# Patient Record
Sex: Male | Born: 1940 | Race: White | Hispanic: No | Smoking: Former smoker
Health system: Southern US, Community
[De-identification: ages and names within clinical notes are randomized; demographics above are authoritative.]

## PROBLEM LIST (undated history)

## (undated) DIAGNOSIS — E785 Hyperlipidemia, unspecified: Secondary | ICD-10-CM

## (undated) DIAGNOSIS — I714 Abdominal aortic aneurysm, without rupture, unspecified: Secondary | ICD-10-CM

## (undated) DIAGNOSIS — N189 Chronic kidney disease, unspecified: Secondary | ICD-10-CM

## (undated) DIAGNOSIS — I10 Essential (primary) hypertension: Secondary | ICD-10-CM

## (undated) DIAGNOSIS — C61 Malignant neoplasm of prostate: Secondary | ICD-10-CM

## (undated) HISTORY — DX: Hyperlipidemia, unspecified: E78.5

## (undated) HISTORY — DX: Abdominal aortic aneurysm, without rupture: I71.4

## (undated) HISTORY — DX: Abdominal aortic aneurysm, without rupture, unspecified: I71.40

## (undated) HISTORY — DX: Malignant neoplasm of prostate: C61

## (undated) HISTORY — PX: CAROTID ENDARTERECTOMY: SUR193

## (undated) HISTORY — PX: COLONOSCOPY W/ POLYPECTOMY: SHX1380

## (undated) HISTORY — DX: Essential (primary) hypertension: I10

## (undated) HISTORY — PX: TRANSURETHRAL RESECTION OF PROSTATE: SHX73

## (undated) HISTORY — PX: PROSTATE SURGERY: SHX751

## (undated) HISTORY — PX: TONSILLECTOMY AND ADENOIDECTOMY: SUR1326

---

## 2007-02-05 ENCOUNTER — Inpatient Hospital Stay (HOSPITAL_COMMUNITY): Admission: AD | Admit: 2007-02-05 | Discharge: 2007-02-08 | Payer: Self-pay | Admitting: Urology

## 2007-02-26 ENCOUNTER — Encounter (INDEPENDENT_AMBULATORY_CARE_PROVIDER_SITE_OTHER): Payer: Self-pay | Admitting: Urology

## 2007-02-27 ENCOUNTER — Inpatient Hospital Stay (HOSPITAL_COMMUNITY): Admission: RE | Admit: 2007-02-27 | Discharge: 2007-02-28 | Payer: Self-pay | Admitting: Urology

## 2007-03-08 ENCOUNTER — Encounter: Admission: RE | Admit: 2007-03-08 | Discharge: 2007-05-16 | Payer: Self-pay | Admitting: Urology

## 2007-03-26 ENCOUNTER — Ambulatory Visit: Payer: Self-pay | Admitting: Oncology

## 2007-03-26 ENCOUNTER — Ambulatory Visit (HOSPITAL_COMMUNITY): Admission: RE | Admit: 2007-03-26 | Discharge: 2007-03-26 | Payer: Self-pay | Admitting: Urology

## 2007-03-26 ENCOUNTER — Ambulatory Visit (HOSPITAL_BASED_OUTPATIENT_CLINIC_OR_DEPARTMENT_OTHER): Admission: RE | Admit: 2007-03-26 | Discharge: 2007-03-27 | Payer: Self-pay | Admitting: Urology

## 2007-03-27 ENCOUNTER — Ambulatory Visit: Payer: Self-pay | Admitting: Vascular Surgery

## 2007-03-30 LAB — COMPREHENSIVE METABOLIC PANEL
AST: 13 U/L (ref 0–37)
Alkaline Phosphatase: 91 U/L (ref 39–117)
CO2: 21 mEq/L (ref 19–32)
Chloride: 107 mEq/L (ref 96–112)
Creatinine, Ser: 1.73 mg/dL — ABNORMAL HIGH (ref 0.40–1.50)
Total Bilirubin: 0.7 mg/dL (ref 0.3–1.2)
Total Protein: 7.1 g/dL (ref 6.0–8.3)

## 2007-03-30 LAB — CBC WITH DIFFERENTIAL/PLATELET
BASO%: 1.3 % (ref 0.0–2.0)
Basophils Absolute: 0.1 10*3/uL (ref 0.0–0.1)
HCT: 33.4 % — ABNORMAL LOW (ref 38.7–49.9)
HGB: 11.5 g/dL — ABNORMAL LOW (ref 13.0–17.1)
MCHC: 34.4 g/dL (ref 32.0–35.9)
MONO#: 0.5 10*3/uL (ref 0.1–0.9)
NEUT%: 58.3 % (ref 40.0–75.0)
WBC: 7.8 10*3/uL (ref 4.0–10.0)
lymph#: 1.9 10*3/uL (ref 0.9–3.3)

## 2007-06-13 ENCOUNTER — Ambulatory Visit (HOSPITAL_BASED_OUTPATIENT_CLINIC_OR_DEPARTMENT_OTHER): Admission: RE | Admit: 2007-06-13 | Discharge: 2007-06-13 | Payer: Self-pay | Admitting: Urology

## 2007-06-25 ENCOUNTER — Ambulatory Visit: Payer: Self-pay | Admitting: Oncology

## 2007-06-26 ENCOUNTER — Ambulatory Visit: Payer: Self-pay | Admitting: Vascular Surgery

## 2007-06-26 ENCOUNTER — Encounter: Admission: RE | Admit: 2007-06-26 | Discharge: 2007-06-26 | Payer: Self-pay | Admitting: Vascular Surgery

## 2007-06-27 LAB — COMPREHENSIVE METABOLIC PANEL
BUN: 26 mg/dL — ABNORMAL HIGH (ref 6–23)
CO2: 19 mEq/L (ref 19–32)
Creatinine, Ser: 1.75 mg/dL — ABNORMAL HIGH (ref 0.40–1.50)
Glucose, Bld: 140 mg/dL — ABNORMAL HIGH (ref 70–99)
Total Bilirubin: 0.4 mg/dL (ref 0.3–1.2)

## 2007-06-27 LAB — CBC WITH DIFFERENTIAL/PLATELET
Eosinophils Absolute: 0.5 10*3/uL (ref 0.0–0.5)
HCT: 35.2 % — ABNORMAL LOW (ref 38.7–49.9)
LYMPH%: 26.7 % (ref 14.0–48.0)
MONO#: 0.6 10*3/uL (ref 0.1–0.9)
NEUT#: 4.1 10*3/uL (ref 1.5–6.5)
NEUT%: 57.6 % (ref 40.0–75.0)
Platelets: 321 10*3/uL (ref 145–400)
WBC: 7.1 10*3/uL (ref 4.0–10.0)

## 2007-06-27 LAB — PSA: PSA: 0.05 ng/mL — ABNORMAL LOW (ref 0.10–4.00)

## 2007-10-22 ENCOUNTER — Ambulatory Visit (HOSPITAL_BASED_OUTPATIENT_CLINIC_OR_DEPARTMENT_OTHER): Admission: RE | Admit: 2007-10-22 | Discharge: 2007-10-22 | Payer: Self-pay | Admitting: Urology

## 2007-10-22 ENCOUNTER — Ambulatory Visit: Payer: Self-pay | Admitting: Oncology

## 2007-10-24 LAB — COMPREHENSIVE METABOLIC PANEL
ALT: 14 U/L (ref 0–53)
AST: 20 U/L (ref 0–37)
Albumin: 4.5 g/dL (ref 3.5–5.2)
Alkaline Phosphatase: 69 U/L (ref 39–117)
Glucose, Bld: 120 mg/dL — ABNORMAL HIGH (ref 70–99)
Potassium: 4.4 mEq/L (ref 3.5–5.3)
Sodium: 139 mEq/L (ref 135–145)
Total Bilirubin: 0.8 mg/dL (ref 0.3–1.2)
Total Protein: 7.8 g/dL (ref 6.0–8.3)

## 2007-10-24 LAB — CBC WITH DIFFERENTIAL/PLATELET
BASO%: 1.1 % (ref 0.0–2.0)
Eosinophils Absolute: 0.2 10*3/uL (ref 0.0–0.5)
MCHC: 34.2 g/dL (ref 32.0–35.9)
MCV: 82.4 fL (ref 81.6–98.0)
MONO%: 12 % (ref 0.0–13.0)
NEUT#: 2.3 10*3/uL (ref 1.5–6.5)
RBC: 4.43 10*6/uL (ref 4.20–5.71)
RDW: 14.1 % (ref 11.2–14.6)
WBC: 4.3 10*3/uL (ref 4.0–10.0)

## 2008-01-01 ENCOUNTER — Encounter: Admission: RE | Admit: 2008-01-01 | Discharge: 2008-01-01 | Payer: Self-pay | Admitting: Vascular Surgery

## 2008-01-01 ENCOUNTER — Ambulatory Visit: Payer: Self-pay | Admitting: Vascular Surgery

## 2008-02-20 ENCOUNTER — Ambulatory Visit: Payer: Self-pay | Admitting: Oncology

## 2008-02-22 LAB — COMPREHENSIVE METABOLIC PANEL
AST: 14 U/L (ref 0–37)
Albumin: 4.6 g/dL (ref 3.5–5.2)
BUN: 24 mg/dL — ABNORMAL HIGH (ref 6–23)
Calcium: 9.4 mg/dL (ref 8.4–10.5)
Chloride: 103 mEq/L (ref 96–112)
Glucose, Bld: 106 mg/dL — ABNORMAL HIGH (ref 70–99)
Potassium: 4.1 mEq/L (ref 3.5–5.3)
Sodium: 136 mEq/L (ref 135–145)
Total Protein: 7.7 g/dL (ref 6.0–8.3)

## 2008-02-22 LAB — CBC WITH DIFFERENTIAL/PLATELET
Basophils Absolute: 0 10*3/uL (ref 0.0–0.1)
Eosinophils Absolute: 0.3 10*3/uL (ref 0.0–0.5)
HGB: 12.5 g/dL — ABNORMAL LOW (ref 13.0–17.1)
NEUT#: 3.8 10*3/uL (ref 1.5–6.5)
RDW: 14.4 % (ref 11.2–14.6)
WBC: 6.8 10*3/uL (ref 4.0–10.0)
lymph#: 2.2 10*3/uL (ref 0.9–3.3)

## 2008-03-19 ENCOUNTER — Ambulatory Visit (HOSPITAL_BASED_OUTPATIENT_CLINIC_OR_DEPARTMENT_OTHER): Admission: RE | Admit: 2008-03-19 | Discharge: 2008-03-19 | Payer: Self-pay | Admitting: Urology

## 2008-08-12 ENCOUNTER — Ambulatory Visit: Payer: Self-pay | Admitting: Oncology

## 2008-08-15 LAB — CBC WITH DIFFERENTIAL/PLATELET
BASO%: 1.1 % (ref 0.0–2.0)
Basophils Absolute: 0.1 10*3/uL (ref 0.0–0.1)
Eosinophils Absolute: 0.3 10*3/uL (ref 0.0–0.5)
MCHC: 34.2 g/dL (ref 32.0–36.0)
NEUT#: 3.7 10*3/uL (ref 1.5–6.5)
NEUT%: 59.4 % (ref 39.0–75.0)
Platelets: 297 10*3/uL (ref 140–400)
RBC: 4.53 10*6/uL (ref 4.20–5.82)
RDW: 15.2 % — ABNORMAL HIGH (ref 11.0–14.6)
WBC: 6.3 10*3/uL (ref 4.0–10.3)

## 2008-08-15 LAB — PSA: PSA: <0.01 ng/mL — ABNORMAL LOW (ref 0.10–4.00)

## 2008-08-15 LAB — COMPREHENSIVE METABOLIC PANEL
Creatinine, Ser: 1.57 mg/dL — ABNORMAL HIGH (ref 0.40–1.50)
Total Bilirubin: 0.7 mg/dL (ref 0.3–1.2)

## 2008-10-20 ENCOUNTER — Ambulatory Visit (HOSPITAL_BASED_OUTPATIENT_CLINIC_OR_DEPARTMENT_OTHER): Admission: RE | Admit: 2008-10-20 | Discharge: 2008-10-20 | Payer: Self-pay | Admitting: Urology

## 2009-01-30 ENCOUNTER — Ambulatory Visit (HOSPITAL_COMMUNITY): Admission: RE | Admit: 2009-01-30 | Discharge: 2009-01-30 | Payer: Self-pay | Admitting: Urology

## 2009-02-13 ENCOUNTER — Ambulatory Visit: Payer: Self-pay | Admitting: Oncology

## 2009-02-17 LAB — COMPREHENSIVE METABOLIC PANEL
Albumin: 4.5 g/dL (ref 3.5–5.2)
Alkaline Phosphatase: 51 U/L (ref 39–117)
CO2: 24 mEq/L (ref 19–32)
Chloride: 105 mEq/L (ref 96–112)
Creatinine, Ser: 1.68 mg/dL — ABNORMAL HIGH (ref 0.40–1.50)
Glucose, Bld: 97 mg/dL (ref 70–99)
Total Bilirubin: 0.3 mg/dL (ref 0.3–1.2)
Total Protein: 7.5 g/dL (ref 6.0–8.3)

## 2009-02-17 LAB — CBC WITH DIFFERENTIAL/PLATELET
Basophils Absolute: 0.1 10*3/uL (ref 0.0–0.1)
Eosinophils Absolute: 0.4 10*3/uL (ref 0.0–0.5)
HCT: 37.7 % — ABNORMAL LOW (ref 38.4–49.9)
MCV: 86.7 fL (ref 79.3–98.0)
Platelets: 370 10*3/uL (ref 140–400)
RBC: 4.35 10*6/uL (ref 4.20–5.82)
RDW: 14.7 % — ABNORMAL HIGH (ref 11.0–14.6)

## 2009-02-17 LAB — PSA: PSA: 0.01 ng/mL — ABNORMAL LOW (ref 0.10–4.00)

## 2009-06-26 ENCOUNTER — Ambulatory Visit: Payer: Self-pay | Admitting: Oncology

## 2009-08-05 ENCOUNTER — Ambulatory Visit: Payer: Self-pay | Admitting: Oncology

## 2009-09-02 LAB — CBC WITH DIFFERENTIAL/PLATELET
Basophils Absolute: 0 10*3/uL (ref 0.0–0.1)
LYMPH%: 33.2 % (ref 14.0–49.0)
MCHC: 34.1 g/dL (ref 32.0–36.0)
MONO#: 0.7 10*3/uL (ref 0.1–0.9)
MONO%: 9.5 % (ref 0.0–14.0)
lymph#: 2.3 10*3/uL (ref 0.9–3.3)

## 2009-09-02 LAB — COMPREHENSIVE METABOLIC PANEL
ALT: 13 U/L (ref 0–53)
Albumin: 4.4 g/dL (ref 3.5–5.2)
BUN: 24 mg/dL — ABNORMAL HIGH (ref 6–23)
Calcium: 9.5 mg/dL (ref 8.4–10.5)
Chloride: 107 mEq/L (ref 96–112)
Total Protein: 6.9 g/dL (ref 6.0–8.3)

## 2009-09-02 LAB — PSA: PSA: <0.01 ng/mL — ABNORMAL LOW (ref 0.10–4.00)

## 2010-03-02 ENCOUNTER — Ambulatory Visit: Payer: Self-pay | Admitting: Oncology

## 2010-03-11 LAB — COMPREHENSIVE METABOLIC PANEL
ALT: 17 U/L (ref 0–53)
AST: 22 U/L (ref 0–37)
Albumin: 4.1 g/dL (ref 3.5–5.2)
Alkaline Phosphatase: 44 U/L (ref 39–117)
CO2: 25 mEq/L (ref 19–32)
Chloride: 107 mEq/L (ref 96–112)
Creatinine, Ser: 1.41 mg/dL (ref 0.40–1.50)
Glucose, Bld: 150 mg/dL — ABNORMAL HIGH (ref 70–99)
Potassium: 4 mEq/L (ref 3.5–5.3)
Sodium: 140 mEq/L (ref 135–145)

## 2010-03-11 LAB — CBC WITH DIFFERENTIAL/PLATELET
HGB: 13.3 g/dL (ref 13.0–17.1)
MCH: 30.6 pg (ref 27.2–33.4)
NEUT#: 3.9 10*3/uL (ref 1.5–6.5)
NEUT%: 51.6 % (ref 39.0–75.0)
Platelets: 318 10*3/uL (ref 140–400)
RBC: 4.33 10*6/uL (ref 4.20–5.82)
RDW: 13.9 % (ref 11.0–14.6)

## 2010-03-11 LAB — PSA: PSA: 0 ng/mL — ABNORMAL LOW (ref 0.10–4.00)

## 2010-06-07 ENCOUNTER — Encounter: Payer: Self-pay | Admitting: Urology

## 2010-06-16 ENCOUNTER — Ambulatory Visit: Admit: 2010-06-16 | Payer: Self-pay | Admitting: Vascular Surgery

## 2010-06-16 ENCOUNTER — Encounter (INDEPENDENT_AMBULATORY_CARE_PROVIDER_SITE_OTHER): Payer: Medicare Other

## 2010-06-16 ENCOUNTER — Ambulatory Visit (INDEPENDENT_AMBULATORY_CARE_PROVIDER_SITE_OTHER): Payer: Medicare Other | Admitting: Vascular Surgery

## 2010-06-16 DIAGNOSIS — I714 Abdominal aortic aneurysm, without rupture, unspecified: Secondary | ICD-10-CM

## 2010-08-23 LAB — POCT I-STAT 4, (NA,K, GLUC, HGB,HCT)
Glucose, Bld: 97 mg/dL (ref 70–99)
Hemoglobin: 13.9 g/dL (ref 13.0–17.0)
Potassium: 3.7 mEq/L (ref 3.5–5.1)

## 2010-09-23 ENCOUNTER — Other Ambulatory Visit: Payer: Self-pay | Admitting: Oncology

## 2010-09-23 ENCOUNTER — Encounter (HOSPITAL_BASED_OUTPATIENT_CLINIC_OR_DEPARTMENT_OTHER): Payer: Medicare Other | Admitting: Oncology

## 2010-09-23 DIAGNOSIS — C61 Malignant neoplasm of prostate: Secondary | ICD-10-CM

## 2010-09-23 LAB — COMPREHENSIVE METABOLIC PANEL
ALT: 13 U/L (ref 0–53)
AST: 20 U/L (ref 0–37)
Albumin: 4.5 g/dL (ref 3.5–5.2)
Alkaline Phosphatase: 41 U/L (ref 39–117)
BUN: 27 mg/dL — ABNORMAL HIGH (ref 6–23)
Calcium: 9.9 mg/dL (ref 8.4–10.5)
Chloride: 104 mEq/L (ref 96–112)
Glucose, Bld: 103 mg/dL — ABNORMAL HIGH (ref 70–99)
Potassium: 4 mEq/L (ref 3.5–5.3)
Sodium: 139 mEq/L (ref 135–145)

## 2010-09-23 LAB — CBC WITH DIFFERENTIAL/PLATELET
Eosinophils Absolute: 0.5 10*3/uL (ref 0.0–0.5)
HGB: 12.5 g/dL — ABNORMAL LOW (ref 13.0–17.1)
MCHC: 34 g/dL (ref 32.0–36.0)
MCV: 88.4 fL (ref 79.3–98.0)
MONO%: 6.4 % (ref 0.0–14.0)
RBC: 4.17 10*6/uL — ABNORMAL LOW (ref 4.20–5.82)
RDW: 14 % (ref 11.0–14.6)
lymph#: 2.2 10*3/uL (ref 0.9–3.3)

## 2010-09-23 LAB — PSA: PSA: <0.01 ng/mL (ref <=4.00)

## 2010-09-28 NOTE — Op Note (Signed)
NAMEKENT, Jason Henderson              ACCOUNT NO.:  [id number]   MEDICAL RECORD NO.:  [id number]          PATIENT TYPE:  AMB   LOCATION:  [facility]                         FACILITY:  [facility]   PHYSICIAN:  [name], M.D.  DATE OF BIRTH:  [birth year]   DATE OF PROCEDURE:  DATE OF DISCHARGE:                               OPERATIVE REPORT   PREOPERATIVE DIAGNOSES:  1. Metastatic adenocarcinoma of the prostate.  2. Chronic left hydronephrosis.   POSTOPERATIVE DIAGNOSES:  1. Metastatic adenocarcinoma of the prostate.  2. Chronic left hydronephrosis.   PROCEDURE PERFORMED:  Cystoscopy with left double-J stent removal, left  retrograde pyelogram and reinsertion of left double-J stent.   SURGEON:  [name], M.D.   ANESTHESIA:  General.   INDICATIONS:  Jason Henderson is a 70 year old man who was originally  diagnosed with metastatic adenocarcinoma of the prostate.  He remains on  hormonal therapy.  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  The  patient's PSA response on hormonal therapy has been very encouraging.  He has had a chronic left double-J stent which is now going to be  exchanged.  His last stent exchange occurred approximately 4-5 months  ago in June of this year.  He has no new complaints or problems today.   TECHNIQUE AND FINDINGS:  The patient was brought to the operating room,  where he had successful induction of general anesthesia.  He was placed  in the lithotomy position and prepped and draped in the usual manner.  Cystoscopic assessment revealed an unremarkable anterior urethra.  The  patient did have a fairly fixed prostatic urethra consistent with  prostate cancer but no evidence of significant visual obstruction.  The  patient did have some degree of contracture around his bladder neck but  it easily accepted the  cystoscope.  A double-J stent was seen in good  position.  This was extracted partially with a grasping forceps and a  guidewire was placed through the stent up to the left renal pelvis.  The  stent was completely removed.  An open-ended catheter was placed over  the guidewire.   Utilizing fluoroscopic interpretation, contrast was injected and left  retrograde pyelogram was performed.  The patient continued to have  evidence of some extrinsic compression of his distal ureter.   Over the guidewire, we placed a new 8-French 24-cm stent, with good  positioning confirmed with fluoroscopy.  The patient appeared to  tolerate the procedure well.  There were no obvious complications.  He  was brought to the recovery room in stable condition.      [name], M.D.  Electronically Signed     DSG/MEDQ  D:  03/19/2008  T:  03/19/2008  Job:  [id number]

## 2010-09-28 NOTE — Discharge Summary (Signed)
NAMEOCIEL, RETHERFORD              ACCOUNT NO.:  [id number]   MEDICAL RECORD NO.:  [id number]          PATIENT TYPE:  INP   LOCATION:  1420                         FACILITY:  [facility]   PHYSICIAN:  [name], M.D.  DATE OF BIRTH:  09-04-1940   DATE OF ADMISSION:  02/05/2007  DATE OF DISCHARGE:  02/08/2007                               DISCHARGE SUMMARY   DISCHARGE DIAGNOSIS:  1. Bladder neck obstruction.  2. Incomplete bladder emptying/acute urinary retention.  3. Acute renal failure secondary to obstructive uropathy.  4. Hydronephrosis.  5. Hyperkalemia.   HISTORY OF PRESENT ILLNESS:  Mr. Zorn is a 70 year old male with no  previous urologic history.  He came in to see [place] in our office on the  day of admission for the first visit with very longstanding voiding  complaints.  He had had progressively worsening obstructive and  irritative voiding symptoms that have been getting worse for weeks to  months.  In essence, clinically, he had we felt was an overflow  incontinence.  He was having nocturia of at least 8 times per evening  with severe frequency, a weak stream, and frequent small voids.  In our  office, the patient was noted to have a grossly distended bladder over  1000 mL.  Renal ultrasound showed bilateral hydronephrosis.  Foley  catheter insertion yielded 1300 mL of urine.  Stat blood work revealed a  creatinine of 11.2 and a potassium which was elevated at 6.6.  The  patient's PSA was also moderately elevated.  The patient was also noted  to be substantially hypertensive with a blood pressure of 220/100,  thought to be mostly secondary to fluid retention.  The patient was  admitted for careful monitoring of his I's and O's, correction of his  electrolyte disturbances, and overall supportive care.  On the day of  admission, hospitalist consultation was also obtained to help with  monitoring of his hypertension.  The patient was started on some  labetalol.   The  patient continued to improve over the course of admission.  He had a  postobstructive diuresis.  On hospital day #2, his creatinine had  decreased to 9.6 and potassium had normalized to 4.8.  His blood  pressure also come down nicely to 142/88.  The patient continued to  improve clinically.  He continued to have a vigorous diuresis and  creatinine slowly improved.  His potassium actually dropped as low as  3.4, but then normalized.  The patient was discharged on February 08, 2007.  At that time, his creatinine had fallen to 4.4, and his acidosis  had resolved.  Blood pressure was under reasonable control and potassium  was normal at 4.4.   DISPOSITION:  The patient was discharged home with an indwelling Foley  catheter.  He was started on Flomax and also given a prescription for  Vicodin for pain control.  He was also placed on low-dose Cardura and  labetalol for blood pressure management.  He will be followed up in our  office in 3-5 days for repeat assessment of blood work, discussion of  his  PSA, and the next step in his management.           ______________________________  [name], M.D.  Electronically Signed    DSG/MEDQ  D:  05/28/2007  T:  05/28/2007  Job:  [id number]

## 2010-09-28 NOTE — Op Note (Signed)
[name], Jason              ACCOUNT NO.:  [id number]   MEDICAL RECORD NO.:  [id number]          PATIENT TYPE:  OIB   LOCATION:  1410                         [facility]ACILITY:  [facility]   PHYSICIAN:  [name], M.D.  DATE OF BIRTH:  1940/06/12   DATE OF PROCEDURE:  DATE OF DISCHARGE:                               OPERATIVE REPORT   PREOPERATIVE DIAGNOSIS:  1. Urinary retention  2. Bladder neck obstruction.  3. Acute renal failure.  4. Elevated PSA.  5. 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   POSTOPERATIVE DIAGNOSIS:  1. Urinary retention  2. Bladder neck obstruction.  3. Acute renal failure.  4. Elevated PSA.  5. 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   PROCEDURE PERFORMED:  Cystoscopy, TURP and suprapubic tube placement.   SURGEON:  Dr. Barron Alvine.   ANESTHESIA:  Spinal.   INDICATIONS:  [name] is a 70 year old male.  He recently came to  see me with some fairly longstanding obstructive and irritative voiding  symptoms that had not previously been assessed.  The patient clearly was  in the overflow type of urinary situation.  Renal imaging showed  bilateral hydronephrosis with greater than 1000 mL in his bladder yet  the patient was not particularly symptomatic. Creatinine turned out to  be 11.6.  The patient was also markedly hypertensive. He was admitted  acutely for management of his urinary retention with acute renal failure  and hypertension.  He did have a vigorous diuresis and his creatinine  improved significantly.  It has not stabilized at around 2.1.  He has  had a chronic indwelling Foley catheter.  The patient also was noted to  have a significantly elevated PSA when he initially presented to see me.  Rectal exam showed some mild induration.  The patient subsequently had  flexible cystoscopy in the office and had moderate trilobar hyperplasia  with visual obstruction, severe bladder trabecular change and bladder  wall thickening.  The patient was unable to void.  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  He may have some component of neurogenic bladder as  well.  I do not think urodynamics are likely to change my management.  We had an extensive discussion with Mr. Cullers about treatment  options. We discussed the pros and cons of a variety of possibilities.  We have elected now to go ahead with TUR of his prostate to sample the  tissue to hopefully alleviate obstruction and then placement of a  suprapubic tube in case he still has some voiding difficulties.  Full  informed consent has been obtained.   TECHNIQUE AND FINDINGS:  The patient was brought to the operating room  where he had successful spinal anesthetic.  He was placed in lithotomy  position and prepped and draped in the usual manner.  We removed his  Foley catheter and initially performed some dilation with Sissy Hoff  sounds.  One could appreciate a very high-riding middle lobe/median bar  of his prostate with lots of fixation of his prostatic urethra.  This  was concerning for the possibility of prostate  cancer.  We then placed a  28-French continuous flow resectoscope.  Again the patient had some  lateral lobe tissue but certainly a very high-riding prominent median  bar.  Ureteral orifices were identified and preserved.  The bladder  again showed diffuse erythema and trabecular change.  The prostate was  resected by initially taking down the median bar to allow for easier  maneuverability.  We still felt that the bladder neck was really quite  fixed and the tissue appeared to be consistent with prostate cancer,  although frozen section was not done.  We resected the majority of  lateral lobes on both sides and widely opened up the bladder neck  region.  A small amount of apical tissue was preserved and the  sphincteric unit looked healthy and intact at the completion of the  procedure. I would estimate a good 20-25  grams of prostatic tissue was  resected.  Hemostasis was really quite good.   At the completion of the TURP, a suprapubic tube was placed utilizing a  Lowsley retractor. We placed a 22-French 5 mL balloon Foley catheter and  then placed a 22-French 30 mL balloon within the urethra.  Continuous  bladder irrigation was then performed with saline and the urine was  really nice and clear.  The patient was brought to the recovery room in  stable condition without complications or problems.           ______________________________  [name], M.D.  Electronically Signed     DSG/MEDQ  D:  02/26/2007  T:  02/26/2007  Job:  [id number]

## 2010-09-28 NOTE — Op Note (Signed)
Jason Henderson, Jason Henderson              ACCOUNT NO.:  [id number]   MEDICAL RECORD NO.:  [id number]          PATIENT TYPE:  AMB   LOCATION:  [facility]                         [facility]ACILITY:  [facility]   PHYSICIAN:  [name], M.D.  DATE OF BIRTH:  [birth year]   DATE OF PROCEDURE:  06/13/2007  DATE OF DISCHARGE:                               OPERATIVE REPORT   PREOPERATIVE DIAGNOSES:  1. Locally advanced and metastatic adenocarcinoma of the prostate.  2. Urinary retention.  3. Chronic hydronephrosis and distal ureteral obstruction.   POSTOPERATIVE DIAGNOSES:  1. Locally advanced and metastatic adenocarcinoma of the prostate.  2. Urinary retention.  3. Chronic hydronephrosis and distal ureteral obstruction.  4. 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   PROCEDURES PERFORMED:  1. Cystoscopy.  2. Urethral/bladder neck dilation.  3. Left double-J stent exchange.  4. Left retrograde pyelogram.  5. Suprapubic tube change.   SURGEON:  [name], M.D.   ANESTHESIA:  General.   INDICATIONS:  Jason Henderson is a complicated 70 year old male.  He was  diagnosed with poorly differentiated and metastatic adenocarcinoma of  the prostate.  He initially presented with urinary retention and severe  obstructive uropathy with a markedly elevated creatinine.  PSA at that  time was in the 20-30 range.  A TURP showed very high volume Gleason's 9  adenocarcinoma of the prostate.  Metastatic evaluation was positive for  bony metastatic disease.  He had been started on Lupron and temporary  Casodex.  The patient's creatinine, which a one point was in the 12-14  range, did return to 2.1.  The patient, however, continued to have  evidence of left-sided hydronephrosis.  A stent was unable to be placed,  cystoscopically, due to nonvisualization of the left ureteral orifice.  He subsequently had a percutaneous nephrostomy tube, and placement of a  double-J stent several months ago.  He has also  had a continued use of a  suprapubic tube for ongoing urinary retention.  The patient, now,  presents for exchange of his left double-J stent; and changing of his  suprapubic tube.  Clinically, he has continued to reasonably well.   TECHNIQUE AND FINDINGS:  The patient was brought to the operating room  where he had successful induction of general anesthesia.  He was placed  in the lithotomy position and prepped and draped in the usual manner.  Cystoscopy was done, and the anterior urethra was unremarkable.  As we  got to the prostatic fossa, there really was almost complete  obliteration of any lumen.  I was able to place as guidewire through  that area, and confirmed it to be in the bladder with fluoroscopy.   The cystoscope was removed, and the patient was dilated with [facility]  dilators from about 12-French up to 24-French.  I was unable to insert  the cystoscope.  The entire prostatic fossa showed evidence of what  appeared to be high-grade cancer along with some scarring.  The bladder,  itself, was of reduced capacity.  Suprapubic tube was in good position.  The patient's double-J stent was partially removed.  I was  unable to  thread a guidewire through that, and so it was taken out completely.  We  then, with cystoscopy, were able to place a guidewire up to the left  renal pelvis.  Over that guidewire I placed an open-ended stent, and the  guidewire was then removed.   A retrograde pyelogram was then done with the open-ended catheter.  Fluoroscopic interpretation was done.  The patient had some chronic  dilatory changes of the collecting system and ureter.  There appeared to  be ongoing obstruction right at the level of the trigone.  The guidewire  was placed, again, to the renal pelvis; and a 8-French, 24 cm stent was  placed with fluoroscopic as well as visual guidance.  The suprapubic  tube was then changed to a new 22-French suprapubic tube with a 5 mL  balloon in a standard  manner without difficulty or complication.   The patient appeared to tolerate the procedure well.  There were no  obvious complications or problems.  He was brought to recovery room in  stable condition.           ______________________________  [name], M.D.  Electronically Signed     DSG/MEDQ  D:  06/14/2007  T:  06/14/2007  Job:  [id number]   cc:   Blenda Nicely. [employer]

## 2010-09-28 NOTE — Op Note (Signed)
NAMEENDER, RORKE              ACCOUNT NO.:  [id number]   MEDICAL RECORD NO.:  [id number]          PATIENT TYPE:  OUT   LOCATION:  XRAY                         FACILITY:  [facility]   PHYSICIAN:  [name], M.D.  DATE OF BIRTH:  07-29-1940   DATE OF PROCEDURE:  03/26/2007  DATE OF DISCHARGE:                               OPERATIVE REPORT   PREOPERATIVE DIAGNOSES:  1. Left hydronephrosis.  2. Chronic renal insufficiency.  3. Locally advanced and metastatic poorly differentiated      adenocarcinoma of the prostate.   POSTOPERATIVE DIAGNOSES:  1. Left hydronephrosis.  2. Chronic renal insufficiency.  3. Locally advanced and metastatic poorly differentiated      adenocarcinoma of the prostate.   PROCEDURE PERFORMED:  Cystoscopy with attempted left double-J stent  placement, unsuccessful.   SURGEON:  [name], M.D.   ANESTHESIA:  General.   INDICATIONS:  [name] is a 70 year old male with a complicated  history over the last several months.  He presented in urinary retention  with marked renal insufficiency and an elevated PSA.  Renal function  improved with catheter drainage and he was noted to have severe  bilateral hydronephrosis.  Creatinine returned to 2.  No baseline  readings were known.  PSA was only modestly elevated at 28.  TURP  revealed markedly abnormal fixed prostate and pathology revealed almost  all the material contained a very high grade Gleason 5 + 4 equals 9  adenocarcinoma.  Subsequent metastatic workup has revealed significant  retroperitoneal adenopathy and bony metastatic disease and the patient  has been started on hormonal therapy.  Repeat imaging revealed  resolution of right hydro but continued presence of significant left  hydronephrosis thought to be secondary to either tumor involvement of  the trigone and/or extrinsic compression of the ureter from lymph nodes.  We are here now to attempt double-J stent placement.   TECHNIQUE  AND FINDINGS:  The patient was brought to the operating room  where he had successful induction of general anesthesia.  He was placed  in lithotomy position and prepped draped in usual manner.  The patient  continued to have some degree of ongoing obstruction with a markedly  necrotic-appearing prostatic fossa.  Again, his hole bladder neck was  markedly fixed.  We were able to identify the right ureteral orifice but  could never locate the left ureteral orifice despite substantial  attempts.  We attempted to locate the ureter for approximately 30  minutes and I felt that he would require percutaneous nephrolithotomy.  Of note, his suprapubic tube was changed in a  standard manner.  The patient was brought to recovery room and  arrangements will be made for interventional radiology to place a  percutaneous left nephrostomy tube with attempts at internalization of  double-J stent at a later date.           ______________________________  [name], M.D.  Electronically Signed     DSG/MEDQ  D:  03/26/2007  T:  03/27/2007  Job:  [id number]

## 2010-09-28 NOTE — Assessment & Plan Note (Signed)
OFFICE VISIT   Jason Henderson, Jason Henderson  DOB:  01/19/1941                                       06/26/2007  ZOXWR#:[id number]   I saw the patient in the office today for continued followup of his  abdominal aortic aneurysm.  I had originally seen him in consultation on  03/27/2007.  He had recently been diagnosed with a locally advanced  adenocarcinoma of the prostate.  On a CT scan, an incidental finding,  was an abdominal aortic aneurysm, which by my measurement was 5 cm.  I  explained that we generally would not consider elective repair unless  the aneurysm reached 5.5 cm in maximum diameter, and I set him up for a  followup visit in 3 months.  He comes in today after having his CAT  scan, which shows there has been no change in size of his aneurysm,  which is 5.1 cm in maximum diameter.  Since I saw him last, he has  undergone aggressive treatment with hormonal therapy for his cancer.  He  still has a suprapubic catheter.  He is followed by Dr. Isabel Caprice.  He is  scheduled to see the oncologist tomorrow.  He has had no new onset  abdominal or back pain.   REVIEW OF SYSTEMS:  He denies any recent history of chest pain, chest  pressure, palpitations, or arrhythmias.  He has had no bronchitis, asthma, or wheezing.   PHYSICAL EXAMINATION:  This is a pleasant 70 year old gentleman who  appears his stated age.  Blood pressure is 124/69, heart rate is 69.  Lungs are clear bilaterally to auscultation.  On cardiac exam, he has a  regular rate and rhythm.  His abdomen is somewhat difficult to assess.  I cannot palpate his aneurysm, but he is nontender.  He has palpable  femoral pulses and warm, well-perfused feet.   I reviewed his CAT scan, which shows a maximum diameter is 5.1 cm.   I again explained that we would not generally not consider elective  repair, unless the aneurysm reached 5.5 cm in maximum diameter.  This is  based on American Heart Association  guidelines.  Fortunately, if  ultimately he ever does require repair of the aneurysm, it looks like he  would potentially be a candidate for endovascular repair.  I will plan  on seeing him back in 6 months with a followup CT scan.  He knows to  call sooner if he has problems.   Di Kindle. Edilia Bo, M.D.  Electronically Signed   CSD/MEDQ  D:  06/26/2007  T:  06/28/2007  Job:  [id number]   cc:   [name], M.D.  Blenda Nicely. [employer]

## 2010-09-28 NOTE — Op Note (Signed)
Jason Henderson, Jason Henderson              ACCOUNT NO.:  [id number]   MEDICAL RECORD NO.:  [id number]          PATIENT TYPE:  AMB   LOCATION:  [facility]                         FACILITY:  [facility]   PHYSICIAN:  [name], M.D.  DATE OF BIRTH:  1941-04-14   DATE OF PROCEDURE:  DATE OF DISCHARGE:                               OPERATIVE REPORT   PREOPERATIVE DIAGNOSIS:  1. Metastatic adenocarcinoma of the prostate.  2. Chronic left hydronephrosis.   POSTOPERATIVE DIAGNOSIS:  1. Metastatic adenocarcinoma of the prostate.  2. Chronic left hydronephrosis.   PROCEDURE:  Cystoscopy with left double J stent exchange.   SURGEON:  [name], M.D.   ANESTHESIA:  General.   INDICATIONS FOR PROCEDURE:  [name] was diagnosed with poorly  differentiated adenocarcinoma of the prostate which was metastatic.  PSA  was in the 20-30 range.  He had a severe obstructive uropathy with  marked bilateral hydronephrosis and a creatinine of 14.  With relief of  bladder neck obstruction, his creatinine improved to 2.1, but he  continued to have evidence of left-sided hydronephrosis.  Double J stent  was placed and that has been replaced on several occasions.  Clinically  he has done well with a PSA that has been currently undetectable.  It  has been a little over 7 months since his last double J stent exchange  and he is due for that at this point.   TECHNIQUE AND FINDINGS:  The patient was brought to the operating room  where he had successful induction of general anesthesia.  He was placed  in the lithotomy position and prepped and draped in the usual manner.  He received perioperative antibiotics and appropriate surgical timeout  was performed.  Cystoscopy revealed unremarkable anterior urethra.  The  patient had minimal bladder neck obstruction.  He did have some mild  degree of bladder neck contracture.  We were able to get the cystoscope  through this area without much difficulty.  The left  double J stent was  seen in good position.  This was partially extracted with the grasping  forceps and a guide wire was placed through the stent to the left renal  pelvis.  The stent itself did not appear to have any encrustations or  obstruction.  Over the guide wire, we placed a new 8 French 24 cm stent.  Good position was confirmed.  The patient appeared to tolerate the  procedure well and he was brought to the recovery room in stable  condition.      [name], M.D.  Electronically Signed     DSG/MEDQ  D:  10/20/2008  T:  10/20/2008  Job:  [id number]

## 2010-09-28 NOTE — Consult Note (Signed)
VASCULAR SURGERY CONSULTATION   LOMAX, POEHLER  DOB:  [birth year]                                       03/27/2007  ZOXWR#:[id number]   I saw the patient in the office today in consultation concerning an  abdominal aortic aneurysm.  He was referred by Dr. Isabel Caprice.  This is a  pleasant 70 year old gentleman who was initially seen by Dr. Isabel Caprice in  September.  He was having problems with urinary frequency and retention.  He had essentially had a fairly long history of progressive outlet  obstructive symptoms and obstructive uropathy.  This had resulted in a  markedly elevated creatinine.  He had an elevated PSA of 18.  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  This is apparently a very large-volume,  poorly differentiated prostate cancer.  On his most recent CT scan, an  incidental finding was a 5.4 cm infrarenal abdominal aortic aneurysm.  This was done at [facility].  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  He was referred for vascular consultation concerning the  abdominal aortic aneurysm.   The patient has no family history of aneurysmal disease that he is aware  of.  He has had no significant abdominal or back pain.  He states that  his only real physical exams have been by the [employer] every couple of years.   His past medical history is significant for hypertension, which is  fairly new onset.  He denies any history of diabetes,  hypercholesterolemia, history of previous myocardial infarction, history  of congestive heart failure, or history of COPD.   FAMILY HISTORY:  There is no history of abdominal aortic aneurysm that  he is aware of and no history of premature cardiovascular disease.   SOCIAL HISTORY:  He is widowed.   He lost his wife three years ago from a  stroke.  He has one son who lives in [place] but travels to [place]  frequently.  He quit tobacco over 10 years ago.  He does not use alcohol  on a regular basis.   REVIEW OF SYSTEMS:  Documented on the medical history form in his chart.  Of note, he specifically denies any history of claudication, rest pain,  nonhealing ulcers, history of stroke, TIAs, or amaurosis fugax.  He has  had no history of DVT or phlebitis, that he is aware of.   MEDICATIONS:  Documented on the medical history form in his chart.   PHYSICAL EXAMINATION:  This is a pleasant 70 year old gentleman who  appears his stated age.  Blood pressure is 147/67, heart rate 70.  Saturations 95%.  HEENT:  Extraocular motions are intact.  There is no cervical  lymphadenopathy.  I do not detect any carotid bruits.  LUNGS:  Clear bilaterally to auscultation without rales or wheezing.  CARDIAC:  He has a regular rate and rhythm.  ABDOMEN:  Somewhat obese and difficult to palpate his aneurysm, although  it is palpable and nontender.  He has normal-pitched bowel sounds.  He  has a suprapubic catheter in place.  He has palpable femoral, popliteal, and dorsalis pedis pulses  bilaterally.  He has mild bilateral lower  extremity swelling.  NEUROLOGIC:  Nonfocal with good strength in the upper and lower  extremities bilaterally.  He has no rashes or ulcers.  He is alert and  oriented.   I have reviewed his CAT scan which shows an infrarenal abdominal aortic  aneurysm.  The largest measurement that I can obtain in the office today  is 5 cm.  He appears to have a reasonable segment of normal aorta below  the renal arteries, and the aneurysm appears to end at the bifurcation  with some slight ectasis of the left common iliac artery.  He does have  significant iliac lymphadenopathy.   This patient, by my measurement, has a 5 cm infrarenal abdominal aortic  aneurysm.  His biggest issue at this  point, however, is his prostate  cancer, which may have metastasized to the bone and appears to be fairly  invasive.  Dr. Isabel Caprice is dealing with his outlet obstruction, and he  currently has a functioning suprapubic catheter.  He is to be seen by  the medical oncologist to consider chemotherapy and treatment for his  prostate cancer.   With respect to his aneurysm, I have explained to him that we generally  would not consider elective repair of the aneurysm unless it were 5.5 cm  in maximum diameter for a normal risk patient.  This is based on  American Heart Association guidelines.  Certainly, his situation is more  complicated.  If his prostate cancer is successfully controlled and if  the aneurysm enlarges to greater than 5.5 cm, then I would consider  elective repair in the future.  Fortunately, based on the information I  have so far (his CAT scan), it appears that he might be a candidate for  endovascular repair, which certainly would be ideal in his situation,  given his lymphadenopathy.  I am not sure any radiation therapy is  planned, but certainly this would complicate open repair.  Regardless,  we have decided to obtain a follow-up CT scan in three months, and I  will see him back at that time.  We can follow what is happening with  his prostate cancer, and hopefully  he will undergo successful treatment  for this, although based on his history, it sounds like his prognosis is  poor.  I plan on seeing him back in three months when he returns with  his follow-up CT scan.   Spent approximately 30 minutes discussing all of these different  options.   Di Kindle. Edilia Bo, M.D.  Electronically Signed  CSD/MEDQ  D:  03/27/2007  T:  03/28/2007  Job:  [id number]   cc:   [name], M.D.  Blenda Nicely. [employer]

## 2010-09-28 NOTE — Op Note (Signed)
NAMETAHEEM, FRICKE              ACCOUNT NO.:  [id number]   MEDICAL RECORD NO.:  [id number]          PATIENT TYPE:  AMB   LOCATION:  [facility]                         FACILITY:  [facility]   PHYSICIAN:  [name], M.D.  DATE OF BIRTH:  19-Feb-1941   DATE OF PROCEDURE:  DATE OF DISCHARGE:                               OPERATIVE REPORT   PREOPERATIVE DIAGNOSES:  1. History of metastatic adenocarcinoma of the prostate.  2. Chronic left hydronephrosis.  3. History of urethral stricture disease.   POSTOPERATIVE DIAGNOSES:  1. History of metastatic adenocarcinoma of the prostate.  2. Chronic left hydronephrosis.  3. History of urethral stricture disease.   PROCEDURE PERFORMED:  Cystoscopy, left double-J stent removal, left  retrograde pyelogram, reinsertion of left double-J stent.   SURGEON:  [name], M.D.   ANESTHESIA:  General.   INDICATIONS:  Mr. Bearce is a 70 year old male.  He was diagnosed with  metastatic adenocarcinoma of the prostate.  He currently is on Lupron  therapy.  The patient has had documented metastatic disease.  The  patient initially presented with an obstructive uropathy with urinary  retention and marked renal insufficiency.  His creatinine improved from  14 to 2.1, but he continued to have the presence of left hydronephrosis  thought to be secondary to some tumor infiltration of the trigone of his  bladder.  The patient has been on hormonal therapy and has had a good  response to his hormonal therapy, with a PSA which has come down to  close to 0.  A complicating matter has been some outlet obstruction.  He  underwent channel TURP and then developed urethral stricture disease.  Recently, he has been voiding spontaneously with a reasonable stream and  has had no major obstructive complaints.  He has tolerated his double-J  stent well which has been in place now for approximately 4 months.  Initially, this was placed approximately 8 months ago by a  percutaneous  nephrostomy tube placement.  He has no new clinical complaints today and  is here today primarily for stent exchange as well as to reassess his  urethra for stricturing.   TECHNIQUE AND FINDINGS:  The patient was brought to the operating room  where he had successful induction of general anesthesia.  He was placed  in lithotomy position and prepped and draped in the usual manner.   The anterior urethra was relatively unremarkable.  There was some mild  narrowing in the bulbar urethra.  The prostatic urethra was reasonably  open visually.  There was still some fixation with limited mobility of  the cystoscope consistent with previous diagnosis of poorly  differentiated prostate cancer.  Visually, however, the bladder neck was  reasonably opened.  The left double-J stent was seen in good position.  This was partially extracted utilizing a grasping forceps.  A guidewire  was then placed through the stent up to the left renal pelvis.  Over the  stent, we placed an open-ended catheter, and contrast was injected.  Retrograde pyelogram was done with fluoroscopic interpretation by  myself.  The patient continued to have  evidence of extrinsic compression  of the distal ureter.   Over the guidewire, we placed a new 8-French 24-cm stent.  Good position  was confirmed with fluoroscopic as well as visual guidance.  No obvious  complications or problems occurred.  The patient appeared to tolerate  the procedure well.  He was brought to the recovery room in stable  condition.           ______________________________  [name], M.D.  Electronically Signed     DSG/MEDQ  D:  10/22/2007  T:  10/22/2007  Job:  [id number]

## 2010-09-28 NOTE — H&P (Signed)
Jason Henderson, Jason Henderson              ACCOUNT NO.:  [id number]   MEDICAL RECORD NO.:  [id number]          PATIENT TYPE:  INP   LOCATION:  1420                         FACILITY:  [facility]   PHYSICIAN:  [name], M.D.  DATE OF BIRTH:  [birth year]   DATE OF ADMISSION:  02/05/2007  DATE OF DISCHARGE:                              HISTORY & PHYSICAL   CHIEF COMPLAINT:  Urinary retention with obstructive uropathy and acute  renal failure as well as significant metabolic abnormalities.   HISTORY OF PRESENT ILLNESS:  [name] is a 70 year old male.  He  presented to our office today for office visit with regard to some  longstanding voiding issues.  The patient does not have any previous  urologic history.  It appears on questioning that he has had some fairly  longstanding obstructive and irritative voiding symptoms.  His situation  has dramatically worsened over the last several weeks to months.   At this point, he is complaining of nocturia of 6-8 times per evening  with fairly significant frequency, a bit of urgency, a weak stream and  what sounds like some overflow incontinence.  He has had no significant  problems with dysuria or gross hematuria.  He has had no real abdominal  or flank discomfort.  He denies any previous urologic problems.  He  admits that he has not sought medical care for a number of years.   The patient recently stayed with his son, and his son was quite  concerned about his voiding patterns and made the appointment for Mr.  Plain who came in to see [place] by himself.  In our office, the patient  clearly had evidence of an enlarged prostate.  There was also some  induration with a questionable nodule.  PSA was sent.  The patient also  had questionably some gross bladder distention.  An ultrasound showed at  least 1000 mL in his bladder.  The patient subsequently had a renal  ultrasound which showed bilateral hydronephrosis.  A Foley catheter was  inserted and  1300 mL of urine was obtained.  We obtained some stat blood  work, and this showed a markedly elevated creatinine of 11.2, with a  markedly elevated BUN and a potassium of 6.6.  We felt that the patient  had longstanding bladder neck obstruction, which had now culminated in  urinary retention with overflow incontinence, bilateral hydronephrosis  with obstructive uropathy and acute renal failure along with significant  metabolic disturbances.  We were also concerned about the possibility of  prostate cancer, given his abnormal rectal exam, although PSA readings  are now pending at this time.  We strongly recommended that the patient  be admitted to the hospital for additional assessment, stabilization, IV  fluids.  The patient was also noted to be markedly hypertensive in our  office with a blood pressure of 220 over approximately 100.   PAST HISTORY:  Apparently notable for hypertension.  The patient tells  me has been diagnosed with hypertension in the past but is on no  medications.   MEDICATIONS:  He does not take any regular  medications.   ALLERGIES:  He has no allergies.   SURGICAL HISTORY:  Otherwise unremarkable.   FAMILY HISTORY:  Negative for prostate cancer.  He has a remote tobacco  use history.   REVIEW OF SYSTEMS:  His review of systems is positive for some shortness  of breath, some constipation and headaches, otherwise negative.   PHYSICAL EXAMINATION:  GENERAL:  On exam, he is a well-developed, well-  nourished male.  He did appear to be somewhat anxious.  Again, he was  markedly hypertensive but afebrile.  SKIN: warm and dry  NECK:  Showed no obvious JVD.  RESPIRATORY:  Lungs were clear to auscultation.  CARDIOVASCULAR:  Heart regular rate and rhythm.  ABDOMEN:  Soft and nontender without obvious hepatosplenomegaly, obvious  masses or hernia.  There was some questionable bladder distention prior  to catheter insertion.  GU:  Penis showed normal meatus.   Scrotum,  testes, adnexal structures are unremarkable.  Digital rectal exam again  showed 3+ prostate with some induration and nodule along the right base.  EXTREMITIES:  Extremities show trace edema.  NEUROLOGIC: Non-focal. Oriented x 3   Data as listed above.   ASSESSMENT:  Obstructive uropathy with acute renal failure.   We will admit the patient, monitor I&O, correct electrolyte disturbances  and ask hospitalist service to see the patient for additional management  of his hypertension and electrolyte disturbances.  We will monitor him  until the situation stabilizes.  He will definitely need additional  assessment including urodynamics, cystoscopy, assessment of his PSA,  probable ultrasound and biopsy of his prostate etc.           ______________________________  [name], M.D.  Electronically Signed     DSG/MEDQ  D:  02/06/2007  T:  02/07/2007  Job:  [id number]

## 2010-09-28 NOTE — Consult Note (Signed)
Jason Henderson, Jason Henderson              ACCOUNT NO.:  [id number]   MEDICAL RECORD NO.:  [id number]          PATIENT TYPE:  INP   LOCATION:  1420                         [facility]ACILITY:  [facility]   PHYSICIAN:  Jason I Elsaid, MD      DATE OF BIRTH:  Apr 14, 1941   DATE OF CONSULTATION:  02/05/2007  DATE OF DISCHARGE:                                 CONSULTATION   REASON FOR CONSULTATION:  Elevated blood pressure and hyperkalemia.   HISTORY OF PRESENT ILLNESS:  This is a 70 year old Caucasian male who  apparently did not see any physician for more than seven years and he is  not on any medications because he is not being followed up with any  physician.  The patient presented to Dr. Ellin Henderson office today with a  history of increased dribbling and increased urine frequency and  decreased amount of urine almost to the point that the urine amount  ended up to a drop.  Condition also associated with abdominal pain and  feeling of abdominal fullness.  He denies any hematuria.  The patient  was recently with his son in [place], where the son noticed an increased  amount of his dad going to the bathroom, without significant amount of  urine.  He had arranged an appointment with [facility] to see him.  The appointment was scheduled today and the patient was seen by Dr.  Isabel Henderson today.  He had an ultrasound done to the kidney, which showed  evidence of bladder distention and evidence of obstructive uropathy with  moderate hydronephrosis.  In addition, it showed evidence of  deteriorating kidney function.  I do not have the final report and I do  not have the blood work which was done at [facility].  I tried to  get the number but the office is closed at this time.  I tried to reach  Dr. Isabel Henderson, but he is not at the office at this time.  The patient  denies any chest pain, denies any headache.  The patient complains of  headache, mainly frontal, secondary to straining to get the urine out.  Also, the  patient admitted he has tried to decrease his amount of fluid  intake because of the small amount of urine he is getting because he is  scared to go to the bathroom every time.  He denies any numbness or  weakness of extremities.  Denies any chest pain condition associated  with shortness of breath.  Denies any abdominal pain, nausea or  vomiting.   MEDICATIONS:  Unknown.   ALLERGIES:  No known drug allergies.   PAST MEDICAL HISTORY:  As I mentioned, the patient did not see a primary  care physician for more than seven years.   PAST SURGICAL HISTORY:  History of tonsillectomy when he was a kid.   FAMILY HISTORY:  The mother died of lung cancer at age 58 and father  died of colon cancer at age 78.   SOCIAL HISTORY:  Retired from [employer].  He had a degree of MBA in  business.  He used to work as  Presenter, broadcasting.  He quit  smoking more than 15 years ago.  He used to smoke half a pack per day  for 20 years.  He drinks wine occasionally.  Denies any drug abuse.  He  is a widower.  His wife passed three years ago.  He has one son who  lives in [place].   PHYSICAL EXAMINATION:  VITAL SIGNS:  On examination, temperature 97.9,  blood pressure 199/115, pulse 86, respiratory rate 24.  The patient is not in acute respiratory distress or shortness of breath.  He has some shaking of both arms.  HEENT:  Normocephalic, atraumatic.  Pupils equally reactive to light and  accommodation.  Mucosa dry.  NECK:  No lymphadenopathy, no thyromegaly.  No JVD.  HEART:  S1 and S2.  No added sounds.  ABDOMEN:  I cannot palpate the catheter at this time.  The patient has a  Foley catheter, which drained 700 mL of hematuria with some clot in it.  EXTREMITIES:  No lower leg edema.  Pedal pulses intact.  CNS:  The patient is alert and oriented x3 with no evidence of focal  neurological finding.   ASSESSMENT:  1. Acute renal failure secondary to obstructive uropathy.  2. Obstructive  uropathy.  3. Hematuria, thought secondary to insertion of Foley catheter and      after obstructive uropathy.  4. Hyperkalemia, thought secondary to metabolic acidosis.  5. Metabolic acidosis.  6. Hypertension.   PLAN:  1. Follow obstructive uropathy.  Agree with Foley catheter insertion      and irrigation for the hematuria.  Further recommendations to be      addressed by Urology.  2. For hyperkalemia, most probably secondary to metabolic acidosis.      During my dictation, I met Dr. Isabel Henderson and he said CO2 was 10 and      potassium was 6.6.  Most probably hyperkalemia secondary to      metabolic acidosis.  Will place the patient on bicarb 3 ampules      with IV fluid and start him on Bicitra 10 mL q.i.d.  Repeat BMET      within 5 hours to evaluate the potassium.  No need to start      Kayexalate at this time.  3. Hypertension, most probably secondary to #1, or a possible      underlying idiopathic hypertension.  Will start the patient on      labetalol 100 mg p.o. t.i.d. and labetalol IV q.6 h. p.r.n.      Further recommendations to be addressed during hospitalization.  4. Renal insufficiency secondary to obstructive uropathy.  Will      continue with IV fluid, measuring input and output to adjust the      amount of IV fluid.  Repeat BMET in a.m.   Thank you for the consultation.  Will follow with you.      Jason Bosie Helper, MD  Electronically Signed     HIE/MEDQ  D:  02/05/2007  T:  02/06/2007  Job:  [id number]

## 2010-09-28 NOTE — Assessment & Plan Note (Signed)
OFFICE VISIT   Jason Henderson, Jason Henderson  DOB:  22-Apr-1941                                       01/01/2008  EAVWU#:[id number]   I saw the patient in the office today for continued followup of his  abdominal aortic aneurysm.  I last saw him in February of this year at  which time the maximum diameter was 5.1 cm.  Since I saw him in February  he has had no history of significant abdominal or back pain.  He has had  no significant change in his medical history.  He is being treated for  prostate cancer by Dr. Isabel Caprice and has had good results.   REVIEW OF SYSTEMS:  He has had no recent chest pain, chest pressure,  palpitations or arrhythmias.  He has had no bronchitis, asthma or  wheezing.   PHYSICAL EXAMINATION:  This is a pleasant 70 year old gentleman who  appears his stated age.  Blood pressure is 123/69, heart rate is 69.  I  do not detect any carotid bruits.  Lungs are clear bilaterally to  auscultation.  On cardiac exam, he has a regular rate and rhythm.  Abdomen:  Soft and nontender.  Because of his size, it is difficult to  palpate his aneurysm.  He does have palpable femoral pulses.  Both feet  appear adequately perfused without ischemic ulcers.  He has no  significant lower extremity swelling.   I reviewed his CAT scan which shows a maximum diameter of his aneurysm  is 5.1 cm and thus has not changed over the last 6 months.   Overall, the aneurysm remains stable in size.  He understands we  generally would not consider elective repair of in an average risk  patient unless the aneurysm reached 5.5 cm in maximum diameter.  I plan  on seeing him back in 6 months with a followup ultrasound.  He knows to  call sooner if he has problems.   Di Kindle. Edilia Bo, M.D.  Electronically Signed   CSD/MEDQ  D:  01/01/2008  T:  01/02/2008  Job:  [id number]   cc:   [name], M.D.  Blenda Nicely. [employer]

## 2010-11-18 ENCOUNTER — Encounter: Payer: Self-pay | Admitting: Vascular Surgery

## 2010-12-22 ENCOUNTER — Ambulatory Visit: Payer: Medicare Other | Admitting: Vascular Surgery

## 2011-01-19 ENCOUNTER — Ambulatory Visit: Payer: Medicare Other | Admitting: Vascular Surgery

## 2011-01-25 ENCOUNTER — Encounter: Payer: Self-pay | Admitting: Vascular Surgery

## 2011-01-26 ENCOUNTER — Ambulatory Visit (INDEPENDENT_AMBULATORY_CARE_PROVIDER_SITE_OTHER): Payer: Medicare Other | Admitting: *Deleted

## 2011-01-26 ENCOUNTER — Ambulatory Visit: Payer: Medicare Other | Admitting: Vascular Surgery

## 2011-01-26 ENCOUNTER — Ambulatory Visit (INDEPENDENT_AMBULATORY_CARE_PROVIDER_SITE_OTHER): Payer: Medicare Other | Admitting: Vascular Surgery

## 2011-01-26 ENCOUNTER — Encounter: Payer: Self-pay | Admitting: Vascular Surgery

## 2011-01-26 VITALS — BP 112/68 | HR 60 | Resp 12 | Ht 67.0 in | Wt 206.0 lb

## 2011-01-26 DIAGNOSIS — I714 Abdominal aortic aneurysm, without rupture, unspecified: Secondary | ICD-10-CM

## 2011-01-26 NOTE — Progress Notes (Signed)
CC: AAA  HPI: Jason Henderson is a 70 y.o. male last saw on 01/01/2008 with an abdominal aortic aneurysm. At that time the maximum diameter was 5.1 cm. He has been coming in for a routine duplex scans and the aneurysm has remained stable in size. He comes in today for a new visit. He denies any abdominal pain or back pain.  He is followed with prostate cancer by Dr. Isabel Caprice. He states that the prostate cancer has been stable. In addition he has a history of hypertension and hyperlipidemia both of which have been stable on his current medications.  Past Medical History  Diagnosis Date  . Cancer     PROSTATE  . Hypertension   . Abdominal aneurysm without mention of rupture   . Hyperlipidemia     FAMILY HISTORY: History reviewed. No pertinent family history.  SOCIAL HISTORY: History  Substance Use Topics  . Smoking status: Former Smoker    Quit date: 11/17/1997  . Smokeless tobacco: Not on file  . Alcohol Use: No   The patient does state that he recently he began smoking again.  No Known Allergies  Current Outpatient Prescriptions  Medication Sig Dispense Refill  . aspirin 81 MG EC tablet Take 81 mg by mouth daily.        . bicalutamide (CASODEX) 50 MG tablet Take 50 mg by mouth daily.        . calcium carbonate (OS-CAL) 600 MG TABS Take 600 mg by mouth daily.        Marland Kitchen doxazosin (CARDURA) 2 MG tablet Take 2 mg by mouth. One half tablet daily       . fenofibrate 160 MG tablet Take 160 mg by mouth daily.        Marland Kitchen HYDROcodone-acetaminophen (VICODIN) 5-500 MG per tablet Take 1 tablet by mouth every 6 (six) hours as needed.        Marland Kitchen lisinopril (PRINIVIL,ZESTRIL) 40 MG tablet Take 20 mg by mouth daily.        . OXYBUTYNIN CHLORIDE PO Take 5 mg by mouth. Two tablets at bedtime       . simvastatin (ZOCOR) 40 MG tablet Take 20 mg by mouth at bedtime.          REVIEW OF SYSTEMS: CARDIOVASCULAR:  [ ]  chest pain   [ ]  chest pressure   [ ]  palpitations   [ ]  orthopnea   Arly.Keller ] dyspnea on  exertion   [ ]  claudication   [ ]  rest pain   [ ]  DVT   [ ]  phlebitis PULMONARY:   [ ]  productive cough   [ ]  asthma   [ ]  wheezing NEUROLOGIC:   [ ]  weakness  [ ]  paresthesias  [ ]  aphasia  [ ]  amaurosis  [ ]  dizziness HEMATOLOGIC:   [ ]  bleeding problems   [ ]  clotting disorders MUSCULOSKELETAL:  [ ]  joint pain   [ ]  joint swelling GASTROINTESTINAL: [ ]   blood in stool  [ ]   hematemesis GENITOURINARY:  [ ]   dysuria  [ ]   hematuria PSYCHIATRIC:  [ ]  history of major depression INTEGUMENTARY:  [ ]  rashes  [ ]  ulcers CONSTITUTIONAL:  [ ]  fever   [ ]  chills  PHYSICAL EXAM: Filed Vitals:   01/26/11 0939  BP: 112/68  Pulse: 60  Resp: 12   Body mass index is 32.26 kg/(m^2).  GENERAL: The patient appears their stated age. The vital signs are documented above. CARDIOVASCULAR: There is a regular  rate and rhythm without significant murmur appreciated. He has palpable femoral pulses and palpable posterior tibial pulses bilaterally. PULMONARY: There is good air exchange bilaterally without wheezing or rales. ABDOMEN: Soft and non-tender with normal pitched bowel sounds. His aneurysm is palpable and nontender. MUSCULOSKELETAL: There are no major deformities or cyanosis. NEUROLOGIC: No focal weakness or paresthesias are detected. SKIN: There are no ulcers or rashes noted. PSYCHIATRIC: The patient has a normal affect.  DATA: 1. I have independently interpreted his duplex scan of his aorta today. This shows that the maximum diameter is 5.1 cm. I compared this to his previous study in February 2012 when it measured 5.2 cm in maximum diameter.   MEDICAL ISSUES: 1. this patient has a 5.1 cm infrarenal abdominal aortic aneurysm. It has remained stable in size. Have explained that in a normal risk patient would be consider elective repair at 5.5 cm. Given the size of his aneurysm I do think we need to continue his followup duplex scans at 6 month intervals. Ordered a followup duplex scan for 6 months.  I'll plan on seeing him back in one year and last is any significant change in the size of his aneurysm in 6 months. I have again discussed with him the importance of tobacco cessation.

## 2011-02-03 LAB — I-STAT 8, (EC8 V) (CONVERTED LAB)
Acid-base deficit: 1
Bicarbonate: 23.8
Chloride: 111
HCT: 38 — ABNORMAL LOW
Operator id: 268271
pCO2, Ven: 37.7 — ABNORMAL LOW

## 2011-02-10 LAB — POCT I-STAT 4, (NA,K, GLUC, HGB,HCT)
HCT: 39
Hemoglobin: 13.3

## 2011-02-10 NOTE — Procedures (Unsigned)
DUPLEX ULTRASOUND OF ABDOMINAL AORTA  INDICATION:  Followup abdominal aortic aneurysm.  HISTORY: Diabetes:  No. Cardiac:  No. Hypertension:  Yes. Smoking:  Previous. Connective Tissue Disorder: Family History:  No. Previous Surgery:  No.  DUPLEX EXAM:         AP (cm)                   TRANSVERSE (cm) Proximal             3.53 cm                   3.63 cm Mid                  5.01 cm                   5.07 cm Distal               4.18 cm                   4.84 cm Right Iliac          1.51 cm                   1.38 cm Left Iliac           2.36 cm                   2.29 cm  PREVIOUS:  Date: 06/16/2010  AP:  5.2  TRANSVERSE:  4.85  IMPRESSION: 1. Essentially stable abdominal aortic aneurysm measuring 5.01 x 5.07     cm. 2. Proximal aorta measuring larger in diameter in comparison to the     previous exam.  ___________________________________________ Di Kindle. Edilia Bo, M.D.  EM/MEDQ  D:  01/27/2011  T:  01/27/2011  Job:  [id number]

## 2011-02-15 LAB — POCT I-STAT 4, (NA,K, GLUC, HGB,HCT)
Glucose, Bld: 134 — ABNORMAL HIGH
Potassium: 4.5

## 2011-02-22 ENCOUNTER — Encounter: Payer: Self-pay | Admitting: *Deleted

## 2011-02-22 LAB — CBC
HCT: 33.5 — ABNORMAL LOW
Hemoglobin: 11.3 — ABNORMAL LOW
Platelets: 318
RBC: 3.93 — ABNORMAL LOW
WBC: 6.4

## 2011-02-22 LAB — BASIC METABOLIC PANEL
BUN: 18
GFR calc Af Amer: 49 — ABNORMAL LOW
GFR calc non Af Amer: 41 — ABNORMAL LOW
Potassium: 4.9
Sodium: 137

## 2011-02-22 LAB — POCT I-STAT 4, (NA,K, GLUC, HGB,HCT)
HCT: 38 — ABNORMAL LOW
Hemoglobin: 12.9 — ABNORMAL LOW

## 2011-02-22 LAB — APTT: aPTT: 33

## 2011-02-24 LAB — BASIC METABOLIC PANEL
BUN: 20
CO2: 18 — ABNORMAL LOW
CO2: 29
CO2: 31
Calcium: 7.8 — ABNORMAL LOW
Calcium: 7.9 — ABNORMAL LOW
Chloride: 110
Chloride: 104
Chloride: 113 — ABNORMAL HIGH
Chloride: 115 — ABNORMAL HIGH
Creatinine, Ser: 2.08 — ABNORMAL HIGH
Creatinine, Ser: 2.08 — ABNORMAL HIGH
Creatinine, Ser: 6.24 — ABNORMAL HIGH
GFR calc Af Amer: 39 — ABNORMAL LOW
GFR calc Af Amer: 6 — ABNORMAL LOW
GFR calc Af Amer: 7 — ABNORMAL LOW
GFR calc non Af Amer: 32 — ABNORMAL LOW
Glucose, Bld: 111 — ABNORMAL HIGH
Glucose, Bld: 154 — ABNORMAL HIGH
Potassium: 5
Potassium: 6.3
Sodium: 135
Sodium: 143
Sodium: 144
Sodium: 146 — ABNORMAL HIGH

## 2011-02-24 LAB — CBC
Hemoglobin: 11.6 — ABNORMAL LOW
MCHC: 35
MCV: 83.5
Platelets: 321
RBC: 3.97 — ABNORMAL LOW
WBC: 7

## 2011-02-24 LAB — HEMOGLOBIN AND HEMATOCRIT, BLOOD
HCT: 32.8 — ABNORMAL LOW
Hemoglobin: 11.5 — ABNORMAL LOW

## 2011-03-22 ENCOUNTER — Other Ambulatory Visit: Payer: Self-pay | Admitting: Oncology

## 2011-03-22 ENCOUNTER — Telehealth: Payer: Self-pay | Admitting: Oncology

## 2011-03-22 ENCOUNTER — Encounter: Payer: Self-pay | Admitting: *Deleted

## 2011-03-22 ENCOUNTER — Ambulatory Visit (HOSPITAL_BASED_OUTPATIENT_CLINIC_OR_DEPARTMENT_OTHER): Payer: Medicare Other | Admitting: Oncology

## 2011-03-22 VITALS — BP 110/53 | HR 64 | Temp 97.3°F | Ht 67.0 in | Wt 213.4 lb

## 2011-03-22 DIAGNOSIS — C61 Malignant neoplasm of prostate: Secondary | ICD-10-CM

## 2011-03-22 NOTE — Progress Notes (Signed)
Note Dictated

## 2011-03-22 NOTE — Progress Notes (Signed)
CC:   [name], MD [name], M.D.  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  He was diagnosed in October 2008.  He presented with a Gleason score of 5+4=9 and PSA of 18.  He presented with a bulky retroperitoneal and obstructive uropathy.  SECONDARY DIAGNOSIS:  Bilateral hydronephrosis, status post stent placement.  That has currently been removed.  CURRENT THERAPY:  Androgen deprivation with Lupron and Casodex.  His PSA continued to be 0.01 when checked in September 2012.  HISTORY OF PRESENT ILLNESS:  Mr. Cumbie presents today for a followup visit.  He has continued to very well without any major concerns at the time being.  He has continued to perform activities of daily living without any hindrance or decline.  He had not reported any abdominal pain.  He had not reported any back pain.  He had not had any deterioration in his performance status.  REVIEW OF SYSTEMS:  He does not reporting any headaches, blurry vision, or double vision.  He does not reporting any motor or sensory neuropathy.  He does not reporting any alteration in mental status.  He does not reporting any psychiatric issues or depression.  He does not reporting any fevers, chills, or sweats.  He does not reporting any cough, hemoptysis, or hematemesis.  No nausea, vomiting, abdominal pain, hematochezia, or melena.  No genitourinary complaints.  The rest of the review of systems is unremarkable.  PHYSICAL EXAMINATION:  General Appearance:  An alert and awake gentleman who appeared in no active distress.  Vital Signs:  Today, blood pressure is 110/53, heart rate is 67, respirations 20, temperature is 97.  HEENT: Head is normocephalic, atraumatic.  Pupils are equal, round, and reactive to light.  Oral mucosa is moist and pink.  Neck:  Supple.  No lymphadenopathy.  Heart:  Regular rate.  S1, S2.  Lungs:  Clear to auscultation.  No rhonchi, wheeze,  or dullness to percussion.  Abdomen: Soft, nontender.  No hepatosplenomegaly.  Extremities:  No edema.  LABORATORY DATA:  His last PSA was 0.01 done by Dr. Isabel Caprice.  ASSESSMENT AND PLAN:  This is a 70 year old gentleman with the following issues: 1. Hormone sensitive advanced prostate cancer, bulky adenopathy, and     obstructive uropathy.  He continues to have excellent PSA response:     For the time being, we will continue on the current regimen and     follow him in 6 months' time.  He is to continue to be on Lupron     and Casodex.  If his PSA starts to rise again, then we will treat     him with Casodex withdrawal. 2. Obstructive uropathy.  That is no longer an issue.  His creatinine     is under excellent control.   ______________________________ Benjiman Core, M.D. FNS/MEDQ  D:  03/22/2011  T:  03/22/2011  Job:  [id number]

## 2011-03-22 NOTE — Progress Notes (Signed)
Addended by: Benjiman Core on: 03/22/2011 09:31 AM   Modules accepted: Orders, SmartSet

## 2011-03-22 NOTE — Telephone Encounter (Signed)
gve the pt his may 2013 appt calendar °

## 2011-05-27 DIAGNOSIS — R011 Cardiac murmur, unspecified: Secondary | ICD-10-CM | POA: Diagnosis not present

## 2011-05-27 DIAGNOSIS — R221 Localized swelling, mass and lump, neck: Secondary | ICD-10-CM | POA: Diagnosis not present

## 2011-05-27 DIAGNOSIS — Z79899 Other long term (current) drug therapy: Secondary | ICD-10-CM | POA: Diagnosis not present

## 2011-05-27 DIAGNOSIS — I10 Essential (primary) hypertension: Secondary | ICD-10-CM | POA: Diagnosis not present

## 2011-05-27 DIAGNOSIS — E785 Hyperlipidemia, unspecified: Secondary | ICD-10-CM | POA: Diagnosis not present

## 2011-05-27 DIAGNOSIS — Z0000 Encounter for general adult medical examination without abnormal findings: Secondary | ICD-10-CM | POA: Diagnosis not present

## 2011-05-27 DIAGNOSIS — C61 Malignant neoplasm of prostate: Secondary | ICD-10-CM | POA: Diagnosis not present

## 2011-05-27 DIAGNOSIS — J869 Pyothorax without fistula: Secondary | ICD-10-CM | POA: Diagnosis not present

## 2011-05-27 DIAGNOSIS — E559 Vitamin D deficiency, unspecified: Secondary | ICD-10-CM | POA: Diagnosis not present

## 2011-05-27 DIAGNOSIS — N289 Disorder of kidney and ureter, unspecified: Secondary | ICD-10-CM | POA: Diagnosis not present

## 2011-05-27 DIAGNOSIS — I719 Aortic aneurysm of unspecified site, without rupture: Secondary | ICD-10-CM | POA: Diagnosis not present

## 2011-06-06 DIAGNOSIS — N289 Disorder of kidney and ureter, unspecified: Secondary | ICD-10-CM | POA: Diagnosis not present

## 2011-06-06 DIAGNOSIS — N281 Cyst of kidney, acquired: Secondary | ICD-10-CM | POA: Diagnosis not present

## 2011-06-06 DIAGNOSIS — J869 Pyothorax without fistula: Secondary | ICD-10-CM | POA: Diagnosis not present

## 2011-06-06 DIAGNOSIS — I714 Abdominal aortic aneurysm, without rupture: Secondary | ICD-10-CM | POA: Diagnosis not present

## 2011-06-06 DIAGNOSIS — R799 Abnormal finding of blood chemistry, unspecified: Secondary | ICD-10-CM | POA: Diagnosis not present

## 2011-08-02 ENCOUNTER — Encounter: Payer: Self-pay | Admitting: Vascular Surgery

## 2011-08-03 ENCOUNTER — Ambulatory Visit (INDEPENDENT_AMBULATORY_CARE_PROVIDER_SITE_OTHER): Payer: Medicare Other | Admitting: *Deleted

## 2011-08-03 ENCOUNTER — Encounter: Payer: Self-pay | Admitting: Vascular Surgery

## 2011-08-03 ENCOUNTER — Ambulatory Visit (INDEPENDENT_AMBULATORY_CARE_PROVIDER_SITE_OTHER): Payer: Medicare Other | Admitting: Vascular Surgery

## 2011-08-03 VITALS — BP 108/58 | HR 55 | Resp 16 | Ht 67.0 in | Wt 216.0 lb

## 2011-08-03 DIAGNOSIS — I714 Abdominal aortic aneurysm, without rupture: Secondary | ICD-10-CM | POA: Diagnosis not present

## 2011-08-03 NOTE — Progress Notes (Signed)
[facility]  Patient name: Jason Henderson MRN: [id number] DOB: 07-05-1940 Sex: male  REASON FOR VISIT: follow up of abdominal aortic aneurysm  HPI: Jason Henderson is a 71 y.o. male who I have been following with an abdominal aortic aneurysm. As far back as February of 2009 the aneurysm was 5.1 cm in maximum diameter. It had remained relatively stable in size and when he was last examined in September 2012 it was 5.1 cm. He comes in for a routine study today. He has had no abdominal pain or back pain. There is been no significant change in his medical history he has a history of hypertension and hyperlipidemia both of which are stable his current medications are followed closely by his primary care physician. He does tell me that unfortunately he is started smoking again after having quit for 20 years.  Past Medical History  Diagnosis Date  . Hypertension   . Abdominal aneurysm without mention of rupture   . Hyperlipidemia   . Prostate cancer     Family History  Problem Relation Age of Onset  . Cancer Mother   . Cancer Father     SOCIAL HISTORY: History  Substance Use Topics  . Smoking status: Former Smoker    Quit date: 11/17/1997  . Smokeless tobacco: Not on file  . Alcohol Use: No    No Known Allergies  Current Outpatient Prescriptions  Medication Sig Dispense Refill  . aspirin 81 MG EC tablet Take 81 mg by mouth daily.        . B Complex-C (B-COMPLEX WITH VITAMIN C) tablet Take 4,000 tablets by mouth daily.      . bicalutamide (CASODEX) 50 MG tablet Take 50 mg by mouth daily.        . calcium carbonate (OS-CAL) 600 MG TABS Take 600 mg by mouth daily.        Marland Kitchen doxazosin (CARDURA) 2 MG tablet Take 2 mg by mouth. One half tablet daily       . fenofibrate 160 MG tablet Take 160 mg by mouth daily.        Marland Kitchen HYDROcodone-acetaminophen (VICODIN) 5-500 MG per tablet Take 1 tablet by mouth every 6 (six) hours as needed.        Marland Kitchen lisinopril  (PRINIVIL,ZESTRIL) 40 MG tablet Take 10 mg by mouth daily.       . OXYBUTYNIN CHLORIDE PO Take 5 mg by mouth. Two tablets at bedtime       . simvastatin (ZOCOR) 40 MG tablet Take 20 mg by mouth at bedtime.          REVIEW OF SYSTEMS: Arly.Keller ] denotes positive finding; [  ] denotes negative finding  CARDIOVASCULAR:  [ ]  chest pain   [ ]  chest pressure   [ ]  palpitations   [ ]  orthopnea   [ ]  dyspnea on exertion   [ ]  claudication   [ ]  rest pain   [ ]  DVT   [ ]  phlebitis PULMONARY:   [ ]  productive cough   [ ]  asthma   [ ]  wheezing NEUROLOGIC:   [ ]  weakness  [ ]  paresthesias  [ ]  aphasia  [ ]  amaurosis  [ ]  dizziness HEMATOLOGIC:   [ ]  bleeding problems   [ ]  clotting disorders MUSCULOSKELETAL:  [ ]  joint pain   [ ]  joint swelling [ ]  leg swelling GASTROINTESTINAL: [ ]   blood in stool  [ ]   hematemesis GENITOURINARY:  [ ]   dysuria  [ ]   hematuria PSYCHIATRIC:  [ ]  history of major depression INTEGUMENTARY:  [ ]  rashes  [ ]  ulcers CONSTITUTIONAL:  [ ]  fever   [ ]  chills  PHYSICAL EXAM: Filed Vitals:   08/03/11 0940  BP: 108/58  Pulse: 55  Resp: 16  Height: 5\' 7"  (1.702 m)  Weight: 216 lb (97.977 kg)  SpO2: 95%   Body mass index is 33.83 kg/(m^2). GENERAL: The patient is a well-nourished male, in no acute distress. The vital signs are documented above. CARDIOVASCULAR: There is a regular rate and rhythm without significant murmur appreciated. I do not detect carotid bruits. He has palpable femoral pulses. He has diminished but palpable pedal pulses bilaterally. PULMONARY: There is good air exchange bilaterally without wheezing or rales. ABDOMEN: Soft and non-tender with normal pitched bowel sounds. His aneurysm is palpable and nontender. MUSCULOSKELETAL: There are no major deformities or cyanosis. NEUROLOGIC: No focal weakness or paresthesias are detected. SKIN: There are no ulcers or rashes noted. PSYCHIATRIC: The patient has a normal affect.  DATA:  I have independently interpreted  his duplex of his aneurysm. This shows that the maximum diameter his aneurysm is 5.39 cm which is enlarged from 5.1 cm in September 2012. The iliac arteries are ectatic but not aneurysmal.  MEDICAL ISSUES: I've explained in for a normal risk patient we would consider elective repair of the aneurysm at 5.5 cm. The aneurysm is approaching the size. I've discussed the importance of tobacco cessation. I've explained that continued tobacco use does increase his risk of continued aneurysm enlargement and rupture. I've ordered a follow up CT scan in 6 months and I'll see him back at that time. If the aneurysm enlarged is any further we will need to consider elective repair. CT scan will allow [place] to determine if he is a candidate for endovascular repair. I reviewed his CT scan from 2009. At that time it did appear that he would be a potential candidate for endovascular repair. However the study was 4 years ago.  Samiha Denapoli S [facility] [place] Beeper: [phone]

## 2011-08-10 NOTE — Procedures (Unsigned)
DUPLEX ULTRASOUND OF ABDOMINAL AORTA  INDICATION:  Follow up abdominal aortic aneurysm.  HISTORY: Diabetes:  No. Cardiac:  No. Hypertension:  No. Smoking:  Previous. Connective Tissue Disorder: Family History:  No. Previous Surgery:  None.  DUPLEX EXAM:         AP (cm)                   TRANSVERSE (cm) Proximal             2.12 cm                   2.12 cm Mid                  5.35 cm                   5.39 cm Distal               4.76 cm                   4.57 cm Right Iliac          1.38 cm                   1.22 cm Left Iliac           1.65 cm                   1.42 cm  PREVIOUS:  Date: 01/26/2011  AP:  5.01  TRANSVERSE:  5.07  IMPRESSION:  Slight increase in the size of the mid abdominal aortic aneurysm, measuring 5.35 X 5.39 cm/s.  ___________________________________________ Di Kindle. Edilia Bo, M.D.  SS/MEDQ  D:  08/03/2011  T:  08/03/2011  Job:  [id number]

## 2011-09-21 ENCOUNTER — Ambulatory Visit (HOSPITAL_BASED_OUTPATIENT_CLINIC_OR_DEPARTMENT_OTHER): Payer: Medicare Other | Admitting: Oncology

## 2011-09-21 ENCOUNTER — Other Ambulatory Visit (HOSPITAL_BASED_OUTPATIENT_CLINIC_OR_DEPARTMENT_OTHER): Payer: Medicare Other | Admitting: Lab

## 2011-09-21 ENCOUNTER — Telehealth: Payer: Self-pay | Admitting: Oncology

## 2011-09-21 VITALS — BP 117/64 | HR 72 | Temp 97.5°F | Ht 67.0 in | Wt 211.9 lb

## 2011-09-21 DIAGNOSIS — C61 Malignant neoplasm of prostate: Secondary | ICD-10-CM

## 2011-09-21 LAB — CBC WITH DIFFERENTIAL/PLATELET
BASO%: 1 % (ref 0.0–2.0)
Eosinophils Absolute: 0.4 10*3/uL (ref 0.0–0.5)
MCHC: 33.4 g/dL (ref 32.0–36.0)
MONO#: 0.4 10*3/uL (ref 0.1–0.9)
NEUT#: 3.7 10*3/uL (ref 1.5–6.5)
RBC: 4.52 10*6/uL (ref 4.20–5.82)
RDW: 13.5 % (ref 11.0–14.6)
WBC: 7 10*3/uL (ref 4.0–10.3)
lymph#: 2.4 10*3/uL (ref 0.9–3.3)

## 2011-09-21 LAB — COMPREHENSIVE METABOLIC PANEL
Albumin: 4.3 g/dL (ref 3.5–5.2)
Alkaline Phosphatase: 38 U/L — ABNORMAL LOW (ref 39–117)
BUN: 28 mg/dL — ABNORMAL HIGH (ref 6–23)
CO2: 25 mEq/L (ref 19–32)
Calcium: 10.2 mg/dL (ref 8.4–10.5)
Glucose, Bld: 100 mg/dL — ABNORMAL HIGH (ref 70–99)
Potassium: 4.4 mEq/L (ref 3.5–5.3)

## 2011-09-21 LAB — PSA: PSA: <0.01 ng/mL (ref <=4.00)

## 2011-09-21 NOTE — Progress Notes (Signed)
Hematology and Oncology Follow Up Visit  COPELAND NEISEN [id number] Apr 09, 1941 71 y.o. 09/21/2011 12:00 PM  CC: Jason Fuller, MD  Jason Henderson, M.D.   Principle Diagnosis: This is a 71 year old gentleman with hormone sensitive prostate cancer. He was diagnosed in October 2008. He presented with a Gleason score of 5+4=9 and PSA of 18. He presented with a bulky retroperitoneal and obstructive uropathy.  He is also had bilateral hydronephrosis, status post stent placement. That has currently been removed.   Current therapy: Androgen deprivation with Lupron and Casodex. His PSA continued to be 0.01 when checked in September 2012.  Interim History: [name] presents today for a followup visit. He has continued to very well without any major concerns at the time being. He has continued to perform activities of daily living without any hindrance or decline. He had not reported any abdominal pain. He had not reported any back pain. He had not had any deterioration in his performance status. He reports no GU complaints.    Medications: I have reviewed the patient's current medications. Current outpatient prescriptions:aspirin 81 MG EC tablet, Take 81 mg by mouth daily.  , Disp: , Rfl: ;  B Complex-C (B-COMPLEX WITH VITAMIN C) tablet, Take 4,000 tablets by mouth daily., Disp: , Rfl: ;  bicalutamide (CASODEX) 50 MG tablet, Take 50 mg by mouth daily.  , Disp: , Rfl: ;  calcium carbonate (OS-CAL) 600 MG TABS, Take 600 mg by mouth daily.  , Disp: , Rfl:  doxazosin (CARDURA) 2 MG tablet, Take 2 mg by mouth. One half tablet daily , Disp: , Rfl: ;  fenofibrate 160 MG tablet, Take 160 mg by mouth daily.  , Disp: , Rfl: ;  HYDROcodone-acetaminophen (VICODIN) 5-500 MG per tablet, Take 1 tablet by mouth every 6 (six) hours as needed.  , Disp: , Rfl: ;  lisinopril (PRINIVIL,ZESTRIL) 40 MG tablet, Take 10 mg by mouth daily. , Disp: , Rfl:  OXYBUTYNIN CHLORIDE PO, Take 5 mg by mouth. Two tablets at bedtime ,  Disp: , Rfl: ;  simvastatin (ZOCOR) 40 MG tablet, Take 20 mg by mouth at bedtime.  , Disp: , Rfl:   Allergies: No Known Allergies  Past Medical History, Surgical history, Social history, and Family History were reviewed and updated.  Review of Systems: Constitutional:  Negative for fever, chills, night sweats, anorexia, weight loss, pain. Cardiovascular: no chest pain or dyspnea on exertion Respiratory: negative Neurological: negative Dermatological: negative ENT: negative Skin: Negative. Gastrointestinal: no abdominal pain, change in bowel habits, or black or bloody stools Genito-Urinary: negative Hematological and Lymphatic: negative Breast: negative Musculoskeletal: negative Remaining ROS negative. Physical Exam: Blood pressure 117/64, pulse 72, temperature 97.5 F (36.4 C), temperature source Oral, height 5\' 7"  (1.702 m), weight 211 lb 14.4 oz (96.117 kg). ECOG: 1 General appearance: alert Head: Normocephalic, without obvious abnormality, atraumatic Neck: no adenopathy, no carotid bruit, no JVD, supple, symmetrical, trachea midline and thyroid not enlarged, symmetric, no tenderness/mass/nodules Lymph nodes: Cervical, supraclavicular, and axillary nodes normal. Heart:regular rate and rhythm, S1, S2 normal, no murmur, click, rub or gallop Lung:chest clear, no wheezing, rales, normal symmetric air entry Abdomin: soft, non-tender, without masses or organomegaly EXT:no erythema, induration, or nodules   Lab Results: Lab Results  Component Value Date   WBC 8.4 09/23/2010   HGB 12.5* 09/23/2010   HCT 36.8* 09/23/2010   MCV 88.4 09/23/2010   PLT 284 09/23/2010     Chemistry      Component Value Date/Time  NA 139 09/23/2010 1515   K 4.0 09/23/2010 1515   CL 104 09/23/2010 1515   CO2 27 09/23/2010 1515   BUN 27* 09/23/2010 1515   CREATININE 1.49 09/23/2010 1515      Component Value Date/Time   CALCIUM 9.9 09/23/2010 1515   ALKPHOS 41 09/23/2010 1515   AST 20 09/23/2010 1515    ALT 13 09/23/2010 1515   BILITOT 0.4 09/23/2010 1515       Impression and Plan: This is a 71 year old gentleman with the following  issues:  1. Hormone sensitive advanced prostate cancer, bulky adenopathy, and obstructive uropathy. He continues to have excellent PSA response:  For the time being, we will continue on the current regimen and follow him in 6 months' time. He is to continue to be on Lupron and Casodex. If his PSA starts to rise again, then we will treat him with Casodex withdrawal.  2. Obstructive uropathy. That is no longer an issue. His creatinine is under excellent control.    [facility], MD 5/8/201312:00 PM

## 2011-09-21 NOTE — Telephone Encounter (Signed)
appts made and printed for pt aom °

## 2011-09-26 DIAGNOSIS — C61 Malignant neoplasm of prostate: Secondary | ICD-10-CM | POA: Diagnosis not present

## 2011-12-13 DIAGNOSIS — E559 Vitamin D deficiency, unspecified: Secondary | ICD-10-CM | POA: Diagnosis not present

## 2011-12-13 DIAGNOSIS — H9190 Unspecified hearing loss, unspecified ear: Secondary | ICD-10-CM | POA: Diagnosis not present

## 2011-12-13 DIAGNOSIS — E785 Hyperlipidemia, unspecified: Secondary | ICD-10-CM | POA: Diagnosis not present

## 2011-12-13 DIAGNOSIS — I10 Essential (primary) hypertension: Secondary | ICD-10-CM | POA: Diagnosis not present

## 2012-01-31 DIAGNOSIS — C61 Malignant neoplasm of prostate: Secondary | ICD-10-CM | POA: Diagnosis not present

## 2012-01-31 DIAGNOSIS — N1330 Unspecified hydronephrosis: Secondary | ICD-10-CM | POA: Diagnosis not present

## 2012-02-01 ENCOUNTER — Other Ambulatory Visit: Payer: Medicare Other

## 2012-02-01 ENCOUNTER — Ambulatory Visit: Payer: Medicare Other | Admitting: Vascular Surgery

## 2012-02-06 ENCOUNTER — Other Ambulatory Visit: Payer: Self-pay | Admitting: Vascular Surgery

## 2012-02-06 DIAGNOSIS — I714 Abdominal aortic aneurysm, without rupture: Secondary | ICD-10-CM | POA: Diagnosis not present

## 2012-02-07 ENCOUNTER — Encounter: Payer: Self-pay | Admitting: Vascular Surgery

## 2012-02-07 LAB — BUN: BUN: 32 mg/dL — ABNORMAL HIGH (ref 6–23)

## 2012-02-07 LAB — CREATININE, SERUM: Creat: 1.6 mg/dL — ABNORMAL HIGH (ref 0.50–1.35)

## 2012-02-08 ENCOUNTER — Ambulatory Visit
Admission: RE | Admit: 2012-02-08 | Discharge: 2012-02-08 | Disposition: A | Discharge status: Home / Self Care | Payer: Medicare Other | Source: Ambulatory Visit | Attending: Vascular Surgery | Admitting: Vascular Surgery

## 2012-02-08 ENCOUNTER — Encounter: Payer: Self-pay | Admitting: Vascular Surgery

## 2012-02-08 ENCOUNTER — Ambulatory Visit (INDEPENDENT_AMBULATORY_CARE_PROVIDER_SITE_OTHER): Payer: Medicare Other | Admitting: Vascular Surgery

## 2012-02-08 VITALS — BP 115/64 | HR 80 | Ht 67.0 in | Wt 220.0 lb

## 2012-02-08 DIAGNOSIS — I714 Abdominal aortic aneurysm, without rupture, unspecified: Secondary | ICD-10-CM

## 2012-02-08 DIAGNOSIS — I722 Aneurysm of renal artery: Secondary | ICD-10-CM | POA: Diagnosis not present

## 2012-02-08 MED ORDER — IOHEXOL 300 MG/ML  SOLN
75.0000 mL | Freq: Once | INTRAMUSCULAR | Status: AC | PRN
  Administered 2012-02-08: 75 mL via INTRAVENOUS

## 2012-02-08 NOTE — Progress Notes (Signed)
[facility] [place]  Patient name: Jason Henderson MRN: [id number] DOB: 08-11-1940 Sex: male  REASON FOR VISIT: follow up of abdominal aortic aneurysm.  HPI: Jason Henderson is a 71 y.o. male I last saw in March of this year. At that time the aneurysm measured 5.4 cm in maximum diameter. He comes in for a 6 month follow up visit. He denies any abdominal or back pain. There has been no significant change in his medical history. Of note I originally saw him back in 2008 at which time he had been diagnosed with a locally advanced adenocarcinoma of the prostate. He is unremarkable and well from that standpoint and is followed closely by Dr. Isabel Caprice.  Past Medical History  Diagnosis Date  . Hypertension   . Abdominal aneurysm without mention of rupture   . Hyperlipidemia   . Prostate cancer     Family History  Problem Relation Age of Onset  . Cancer Mother   . Cancer Father     SOCIAL HISTORY: History  Substance Use Topics  . Smoking status: Former Smoker    Types: Cigarettes    Quit date: 02/08/2012  . Smokeless tobacco: Never Used   Comment: pt just started using patches today  . Alcohol Use: No    No Known Allergies  Current Outpatient Prescriptions  Medication Sig Dispense Refill  . aspirin 81 MG EC tablet Take 81 mg by mouth daily.        . bicalutamide (CASODEX) 50 MG tablet Take 50 mg by mouth daily.        . calcium carbonate (OS-CAL) 600 MG TABS Take 600 mg by mouth daily.        . Calcium Carbonate-Vitamin D (CALTRATE 600+D PO) Take 1 tablet by mouth daily.      . Cholecalciferol (VITAMIN D3) 2000 UNITS TABS Take 2,000 Int'l Units by mouth. Alternates 1 tablet one day, then 2 tablets the next day      . doxazosin (CARDURA) 2 MG tablet Take 2 mg by mouth. One half tablet daily       . fenofibrate 160 MG tablet Take 160 mg by mouth daily.        Marland Kitchen HYDROcodone-acetaminophen (VICODIN) 5-500 MG per tablet Take 1 tablet by mouth every 6 (six) hours as  needed.        Marland Kitchen lisinopril (PRINIVIL,ZESTRIL) 40 MG tablet Take 10 mg by mouth daily.       . OXYBUTYNIN CHLORIDE PO Take 5 mg by mouth. Two tablets at bedtime       . simvastatin (ZOCOR) 40 MG tablet Take 20 mg by mouth at bedtime.         No current facility-administered medications for this visit.   Facility-Administered Medications Ordered in Other Visits  Medication Dose Route Frequency Provider Last Rate Last Dose  . iohexol (OMNIPAQUE) 300 MG/ML solution 75 mL  75 mL Intravenous Once PRN Simonne Come, MD   75 mL at 02/08/12 1125    REVIEW OF SYSTEMS: Arly.Keller ] denotes positive finding; [  ] denotes negative finding  CARDIOVASCULAR:  [ ]  chest pain   [ ]  chest pressure   [ ]  palpitations   [ ]  orthopnea   [ ]  dyspnea on exertion   [ ]  claudication   [ ]  rest pain   [ ]  DVT   [ ]  phlebitis PULMONARY:   [ ]  productive cough   [ ]  asthma   [ ]  wheezing NEUROLOGIC:   [ ]   weakness  [ ]  paresthesias  [ ]  aphasia  [ ]  amaurosis  [ ]  dizziness HEMATOLOGIC:   [ ]  bleeding problems   [ ]  clotting disorders MUSCULOSKELETAL:  [ ]  joint pain   [ ]  joint swelling [ ]  leg swelling GASTROINTESTINAL: [ ]   blood in stool  [ ]   hematemesis GENITOURINARY:  [ ]   dysuria  [ ]   hematuria PSYCHIATRIC:  [ ]  history of major depression INTEGUMENTARY:  [ ]  rashes  [ ]  ulcers CONSTITUTIONAL:  [ ]  fever   [ ]  chills  PHYSICAL EXAM: Filed Vitals:   02/08/12 1244  BP: 115/64  Pulse: 80  Height: 5\' 7"  (1.702 m)  Weight: 220 lb (99.791 kg)  SpO2: 98%   Body mass index is 34.46 kg/(m^2). GENERAL: The patient is a well-nourished male, in no acute distress. The vital signs are documented above. CARDIOVASCULAR: There is a regular rate and rhythm. I do not detect carotid bruits. He has palpable femoral pulses bilaterally. He has weakly palpable dorsalis pedis pulses bilaterally. He has no significant lower extremity swelling. PULMONARY: There is good air exchange bilaterally without wheezing or rales. ABDOMEN: Soft  and non-tender with normal pitched bowel sounds. It is difficult to palpate his aneurysm because of his size. MUSCULOSKELETAL: There are no major deformities or cyanosis. NEUROLOGIC: No focal weakness or paresthesias are detected. SKIN: There are no ulcers or rashes noted. PSYCHIATRIC: The patient has a normal affect.  DATA:  I have independently interpreted his CT scan of the abdomen and pelvis. Maximum diameter is 5.7 cm although this may be falsely elevated because of some tortuosity of the aorta. I think the aneurysm has increased slightly in size compared to March. The left common iliac artery was slightly aneurysmal at 2.4 cm. Based on his CT angiogram does appear to be a candidate for EVAR.  MEDICAL ISSUES:  Abdominal aneurysm without mention of rupture By my measurement the aneurysm is now 5.5 cm in maximum diameter. This point I think we should pursue elective repair of the aneurysm. Based on his CT scan he does appear to be a candidate for endovascular repair of his aneurysm. 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 I've explained that preoperatively we would need to obtain a cardiac workup and also an aortogram. His only family has a son in [place] and he would like to discuss this with him before completing his workup and scheduling repair of the aneurysm. Once we hear back from him we can proceed. I have had a long discussion with the patient in the office today comparing endovascular versus open repair of the aneurysm and the advantages and disadvantages of each.   Tinita Brooker S [facility] [place] Beeper: [phone]

## 2012-02-08 NOTE — Assessment & Plan Note (Signed)
By my measurement the aneurysm is now 5.5 cm in maximum diameter. This point I think we should pursue elective repair of the aneurysm. Based on his CT scan he does appear to be a candidate for endovascular repair of his aneurysm. 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 I've explained that preoperatively we would need to obtain a cardiac workup and also an aortogram. His only family has a son in [place] and he would like to discuss this with him before completing his workup and scheduling repair of the aneurysm. Once we hear back from him we can proceed. I have had a long discussion with the patient in the office today comparing endovascular versus open repair of the aneurysm and the advantages and disadvantages of each.

## 2012-03-23 ENCOUNTER — Encounter: Payer: Self-pay | Admitting: *Deleted

## 2012-03-23 ENCOUNTER — Ambulatory Visit (HOSPITAL_BASED_OUTPATIENT_CLINIC_OR_DEPARTMENT_OTHER): Payer: Medicare Other | Admitting: Oncology

## 2012-03-23 ENCOUNTER — Telehealth: Payer: Self-pay | Admitting: Oncology

## 2012-03-23 ENCOUNTER — Other Ambulatory Visit (HOSPITAL_BASED_OUTPATIENT_CLINIC_OR_DEPARTMENT_OTHER): Payer: Medicare Other

## 2012-03-23 ENCOUNTER — Other Ambulatory Visit: Payer: Self-pay | Admitting: *Deleted

## 2012-03-23 DIAGNOSIS — I714 Abdominal aortic aneurysm, without rupture: Secondary | ICD-10-CM

## 2012-03-23 DIAGNOSIS — Z01818 Encounter for other preprocedural examination: Secondary | ICD-10-CM

## 2012-03-23 DIAGNOSIS — C61 Malignant neoplasm of prostate: Secondary | ICD-10-CM

## 2012-03-23 LAB — CBC WITH DIFFERENTIAL/PLATELET
Basophils Absolute: 0.2 10*3/uL — ABNORMAL HIGH (ref 0.0–0.1)
EOS%: 5.4 % (ref 0.0–7.0)
Eosinophils Absolute: 0.5 10*3/uL (ref 0.0–0.5)
HGB: 13.6 g/dL (ref 13.0–17.1)
MONO%: 8.2 % (ref 0.0–14.0)
NEUT#: 4.6 10*3/uL (ref 1.5–6.5)
RBC: 4.39 10*6/uL (ref 4.20–5.82)
RDW: 14 % (ref 11.0–14.6)
lymph#: 3 10*3/uL (ref 0.9–3.3)

## 2012-03-23 LAB — COMPREHENSIVE METABOLIC PANEL (CC13)
ALT: 16 U/L (ref 0–55)
Alkaline Phosphatase: 43 U/L (ref 40–150)
Creatinine: 1.6 mg/dL — ABNORMAL HIGH (ref 0.7–1.3)
Sodium: 137 mEq/L (ref 136–145)
Total Bilirubin: 0.59 mg/dL (ref 0.20–1.20)
Total Protein: 7.6 g/dL (ref 6.4–8.3)

## 2012-03-23 NOTE — Telephone Encounter (Signed)
gv and printed appt schedule for pt for June 2014 ° °

## 2012-03-23 NOTE — Progress Notes (Signed)
Hematology and Oncology Follow Up Visit  Jason Henderson [id number] 03-07-1941 71 y.o. 03/23/2012 12:10 PM  CC: Jason Fuller, MD  Jason Henderson, M.D.   Principle Diagnosis: This is a 71 year old gentleman with hormone sensitive prostate cancer. He was diagnosed in October 2008. He presented with a Gleason score of 5+4=9 and PSA of 18. He presented with a bulky retroperitoneal and obstructive uropathy.  He is also had bilateral hydronephrosis, status post stent placement. That has currently been removed.  Current therapy: Androgen deprivation with Lupron and Casodex. His PSA continued to be 0.01 when checked in September 2012.  Interim History: Jason Henderson presents today for a followup visit. He has continued to very well without any major concerns at the time being. He has continued to perform activities of daily living without any hindrance or decline. He had not reported any abdominal pain. He had not reported any back pain. He had not had any deterioration in his performance status. He reports no GU complaints.  He is being evaluated for possible abdominal aortic aneurysm repair.   Medications: I have reviewed the patient's current medications. Current outpatient prescriptions:aspirin 81 MG EC tablet, Take 81 mg by mouth daily.  , Disp: , Rfl: ;  bicalutamide (CASODEX) 50 MG tablet, Take 50 mg by mouth daily.  , Disp: , Rfl: ;  calcium carbonate (OS-CAL) 600 MG TABS, Take 600 mg by mouth daily.  , Disp: , Rfl: ;  Calcium Carbonate-Vitamin D (CALTRATE 600+D PO), Take 1 tablet by mouth daily., Disp: , Rfl:  Cholecalciferol (VITAMIN D3) 2000 UNITS TABS, Take 2,000 Int'l Units by mouth. Alternates 1 tablet one day, then 2 tablets the next day, Disp: , Rfl: ;  doxazosin (CARDURA) 2 MG tablet, Take 2 mg by mouth. One half tablet daily , Disp: , Rfl: ;  fenofibrate 160 MG tablet, Take 160 mg by mouth daily.  , Disp: , Rfl: ;  lisinopril (PRINIVIL,ZESTRIL) 40 MG tablet, Take 10 mg by mouth daily.  , Disp: , Rfl:  OXYBUTYNIN CHLORIDE PO, Take 5 mg by mouth. Two tablets at bedtime , Disp: , Rfl: ;  simvastatin (ZOCOR) 40 MG tablet, Take 20 mg by mouth at bedtime.  , Disp: , Rfl: ;  HYDROcodone-acetaminophen (VICODIN) 5-500 MG per tablet, Take 1 tablet by mouth every 6 (six) hours as needed.  , Disp: , Rfl:   Allergies: No Known Allergies  Past Medical History, Surgical history, Social history, and Family History were reviewed and updated.  Review of Systems: Constitutional:  Negative for fever, chills, night sweats, anorexia, weight loss, pain. Cardiovascular: no chest pain or dyspnea on exertion Respiratory: negative Neurological: negative Dermatological: negative ENT: negative Skin: Negative. Gastrointestinal: no abdominal pain, change in bowel habits, or black or bloody stools Genito-Urinary: negative Hematological and Lymphatic: negative Breast: negative Musculoskeletal: negative Remaining ROS negative. Physical Exam: There were no vitals taken for this visit. ECOG: 1 General appearance: alert Head: Normocephalic, without obvious abnormality, atraumatic Neck: no adenopathy, no carotid bruit, no JVD, supple, symmetrical, trachea midline and thyroid not enlarged, symmetric, no tenderness/mass/nodules Lymph nodes: Cervical, supraclavicular, and axillary nodes normal. Heart:regular rate and rhythm, S1, S2 normal, no murmur, click, rub or gallop Lung:chest clear, no wheezing, rales, normal symmetric air entry Abdomin: soft, non-tender, without masses or organomegaly EXT:no erythema, induration, or nodules   Lab Results: Lab Results  Component Value Date   WBC 9.0 03/23/2012   HGB 13.6 03/23/2012   HCT 39.8 03/23/2012   MCV 90.7 03/23/2012  PLT 274 03/23/2012     Chemistry      Component Value Date/Time   NA 136 09/21/2011 1135   K 4.4 09/21/2011 1135   CL 104 09/21/2011 1135   CO2 25 09/21/2011 1135   BUN 32* 02/06/2012 1553   CREATININE 1.60* 02/06/2012 1553   CREATININE  1.38* 09/21/2011 1135      Component Value Date/Time   CALCIUM 10.2 09/21/2011 1135   ALKPHOS 38* 09/21/2011 1135   AST 20 09/21/2011 1135   ALT 15 09/21/2011 1135   BILITOT 0.5 09/21/2011 1135      Impression and Plan: This is a 71 year old gentleman with the following  issues:  1. Hormone sensitive advanced prostate cancer, bulky adenopathy, and obstructive uropathy. He continues to have excellent PSA response:  For the time being, we will continue on the current regimen and follow him in 6 months' time. He is to continue to be on Lupron and Casodex. If his PSA starts to rise again, then we will treat him with Casodex withdrawal.  2. Obstructive uropathy. That is no longer an issue. His creatinine is under excellent control.    Jason Camilo, MD 11/8/201312:10 PM

## 2012-03-24 LAB — PSA: PSA: <0.01 ng/mL (ref <=4.00)

## 2012-03-26 ENCOUNTER — Other Ambulatory Visit: Payer: Self-pay | Admitting: *Deleted

## 2012-03-27 ENCOUNTER — Encounter (HOSPITAL_COMMUNITY): Payer: Self-pay | Admitting: Pharmacy Technician

## 2012-03-29 ENCOUNTER — Encounter: Payer: Self-pay | Admitting: Cardiology

## 2012-03-29 ENCOUNTER — Ambulatory Visit (INDEPENDENT_AMBULATORY_CARE_PROVIDER_SITE_OTHER): Payer: Medicare Other | Admitting: Cardiology

## 2012-03-29 VITALS — BP 128/66 | HR 75 | Ht 67.0 in | Wt 225.0 lb

## 2012-03-29 DIAGNOSIS — I10 Essential (primary) hypertension: Secondary | ICD-10-CM

## 2012-03-29 NOTE — Patient Instructions (Addendum)
Your physician has requested that you have an exercise tolerance test TOMORROW 03/30/2012 with Tereso Newcomer, PA or Norma Fredrickson, NP-C. For further information please visit [email or web address] Please also follow instruction sheet, as given.  Your physician recommends that you schedule a follow-up appointment with Dr. Antoine Poche as needed.

## 2012-03-29 NOTE — Progress Notes (Signed)
HPI The patient presents for evaluation prior to aortic aneurysm repair. He is apparently going to have stent grafting of this. He has no prior cardiac history but he does have significant cardiovascular risk factors.  He has hypertension and hyperlipidemia (controlled) and continues to smoke cigarettes.  He does not report any cardiac symptoms however. He can climb a flight of stairs and he works part-time some physical activity. With this he denies any symptoms such as chest discomfort, neck or arm discomfort. He has no palpitations, presyncope or syncope. He has no PND or orthopnea. He's had no recent weight gain or edema. He does have an aneurysm  No Known Allergies  Current Outpatient Prescriptions  Medication Sig Dispense Refill  . aspirin 81 MG EC tablet Take 81 mg by mouth daily.        . bicalutamide (CASODEX) 50 MG tablet Take 50 mg by mouth daily.        . Calcium Carbonate-Vitamin D (CALTRATE 600+D PO) Take 1 tablet by mouth daily.      . Cholecalciferol (VITAMIN D3) 2000 UNITS TABS Take 2,000 Int'l Units by mouth. Alternates 1 tablet one day, then 2 tablets the next day      . doxazosin (CARDURA) 2 MG tablet Take 1 mg by mouth daily.       . fenofibrate 160 MG tablet Take 160 mg by mouth daily.        Marland Kitchen lisinopril (PRINIVIL,ZESTRIL) 20 MG tablet Take 10 mg by mouth daily.      Marland Kitchen oxybutynin (DITROPAN-XL) 10 MG 24 hr tablet Take 10 mg by mouth at bedtime.      . simvastatin (ZOCOR) 40 MG tablet Take 20 mg by mouth at bedtime.          Past Medical History  Diagnosis Date  . Hypertension   . Abdominal aneurysm without mention of rupture   . Hyperlipidemia   . Prostate cancer     Past Surgical History  Procedure Date  . Transurethral resection of prostate     Family History  Problem Relation Age of Onset  . Cancer Mother   . Cancer Father     History   Social History  . Marital Status: Widowed    Spouse Name: N/A    Number of Children: N/A  . Years of  Education: N/A   Occupational History  . Not on file.   Social History Main Topics  . Smoking status: Former Smoker    Types: Cigarettes    Quit date: 02/08/2012  . Smokeless tobacco: Never Used     Comment: pt just started using patches today  . Alcohol Use: No  . Drug Use: No  . Sexually Active: Not on file   Other Topics Concern  . Not on file   Social History Narrative  . No narrative on file    ROS:  Positive for easy bruising.  Otherwise as stated in the HPI and negative for all other systems.  PHYSICAL EXAM BP 128/66  Pulse 75  Ht 5\' 7"  (1.702 m)  Wt 225 lb (102.059 kg)  BMI 35.24 kg/m2 GENERAL:  Well appearing HEENT:  Pupils equal round and reactive, fundi not visualized, oral mucosa unremarkable NECK:  No jugular venous distention, waveform within normal limits, carotid upstroke brisk and symmetric, no bruits, no thyromegaly LYMPHATICS:  No cervical, inguinal adenopathy LUNGS:  Clear to auscultation bilaterally BACK:  No CVA tenderness CHEST:  Unremarkable HEART:  PMI not displaced or sustained,S1 and  S2 within normal limits, no S3, no S4, no clicks, no rubs, no murmurs ABD:  Flat, positive bowel sounds normal in frequency in pitch, no bruits, no rebound, no guarding, positive midline pulsatile mass, no hepatomegaly, no splenomegaly EXT:  2 plus pulses throughout, no edema, no cyanosis no clubbing SKIN:  No rashes no nodules NEURO:  Cranial nerves II through XII grossly intact, motor grossly intact throughout PSYCH:  Cognitively intact, oriented to person place and time  EKG:  Sinus rhythm, rate 75, axis within normal limits, intervals within normal limits, no acute ST-T wave changes.  03/29/2012  ASSESSMENT AND PLAN  PRE OP The patient does have significant cardiovascular risk factors. Given this screening with a stress test is indicated. I think an exercise treadmill test would be reasonable. If this is normal then no further cardiovascular testing would be  needed.  HTN His blood pressure is controlled. He will continue the meds as listed.  TOBACCO ABUSE I had a long discussion with him about the need to stop smoking. He has patches. We will start with this.

## 2012-03-30 ENCOUNTER — Encounter: Payer: Medicare Other | Admitting: Nurse Practitioner

## 2012-04-01 DIAGNOSIS — I10 Essential (primary) hypertension: Secondary | ICD-10-CM | POA: Diagnosis not present

## 2012-04-01 DIAGNOSIS — I714 Abdominal aortic aneurysm, without rupture: Secondary | ICD-10-CM | POA: Diagnosis not present

## 2012-04-01 DIAGNOSIS — E785 Hyperlipidemia, unspecified: Secondary | ICD-10-CM | POA: Diagnosis not present

## 2012-04-01 MED ORDER — DEXTROSE 5 % IV SOLN
1.5000 g | INTRAVENOUS | Status: DC
  Filled 2012-04-01: qty 1.5

## 2012-04-02 ENCOUNTER — Ambulatory Visit (HOSPITAL_COMMUNITY)
Admission: RE | Admit: 2012-04-02 | Discharge: 2012-04-02 | Disposition: A | Discharge status: Home / Self Care | Payer: Medicare Other | Source: Ambulatory Visit | Attending: Vascular Surgery | Admitting: Vascular Surgery

## 2012-04-02 ENCOUNTER — Ambulatory Visit (HOSPITAL_COMMUNITY): Payer: Medicare Other

## 2012-04-02 ENCOUNTER — Encounter (HOSPITAL_COMMUNITY): Payer: Self-pay

## 2012-04-02 ENCOUNTER — Encounter (HOSPITAL_COMMUNITY)
Admission: RE | Disposition: A | Discharge status: Home / Self Care | Payer: Self-pay | Source: Ambulatory Visit | Attending: Vascular Surgery

## 2012-04-02 ENCOUNTER — Encounter (HOSPITAL_COMMUNITY)
Admission: RE | Admit: 2012-04-02 | Discharge: 2012-04-02 | Disposition: A | Discharge status: Home / Self Care | Payer: Medicare Other | Source: Ambulatory Visit | Attending: Vascular Surgery | Admitting: Vascular Surgery

## 2012-04-02 DIAGNOSIS — I10 Essential (primary) hypertension: Secondary | ICD-10-CM | POA: Insufficient documentation

## 2012-04-02 DIAGNOSIS — I714 Abdominal aortic aneurysm, without rupture, unspecified: Secondary | ICD-10-CM | POA: Insufficient documentation

## 2012-04-02 DIAGNOSIS — R918 Other nonspecific abnormal finding of lung field: Secondary | ICD-10-CM | POA: Diagnosis not present

## 2012-04-02 DIAGNOSIS — Z01818 Encounter for other preprocedural examination: Secondary | ICD-10-CM | POA: Diagnosis not present

## 2012-04-02 DIAGNOSIS — E785 Hyperlipidemia, unspecified: Secondary | ICD-10-CM | POA: Insufficient documentation

## 2012-04-02 HISTORY — PX: ABDOMINAL AORTAGRAM: SHX5454

## 2012-04-02 HISTORY — DX: Chronic kidney disease, unspecified: N18.9

## 2012-04-02 LAB — COMPREHENSIVE METABOLIC PANEL
BUN: 33 mg/dL — ABNORMAL HIGH (ref 6–23)
CO2: 22 mEq/L (ref 19–32)
Calcium: 9.9 mg/dL (ref 8.4–10.5)
Creatinine, Ser: 1.39 mg/dL — ABNORMAL HIGH (ref 0.50–1.35)
GFR calc Af Amer: 58 mL/min — ABNORMAL LOW (ref >90)
GFR calc non Af Amer: 50 mL/min — ABNORMAL LOW (ref >90)
Glucose, Bld: 171 mg/dL — ABNORMAL HIGH (ref 70–99)
Total Protein: 6.8 g/dL (ref 6.0–8.3)

## 2012-04-02 LAB — CBC
HCT: 37.5 % — ABNORMAL LOW (ref 39.0–52.0)
Hemoglobin: 12.4 g/dL — ABNORMAL LOW (ref 13.0–17.0)
MCH: 30 pg (ref 26.0–34.0)
MCHC: 33.1 g/dL (ref 30.0–36.0)
MCV: 90.6 fL (ref 78.0–100.0)

## 2012-04-02 LAB — POCT I-STAT, CHEM 8
Chloride: 107 mEq/L (ref 96–112)
Creatinine, Ser: 1.6 mg/dL — ABNORMAL HIGH (ref 0.50–1.35)
Glucose, Bld: 111 mg/dL — ABNORMAL HIGH (ref 70–99)
HCT: 41 % (ref 39.0–52.0)
Hemoglobin: 13.9 g/dL (ref 13.0–17.0)
Potassium: 4.5 mEq/L (ref 3.5–5.1)
Sodium: 140 mEq/L (ref 135–145)

## 2012-04-02 LAB — URINALYSIS, ROUTINE W REFLEX MICROSCOPIC
Bilirubin Urine: NEGATIVE
Ketones, ur: NEGATIVE mg/dL
Leukocytes, UA: NEGATIVE
Nitrite: NEGATIVE
Protein, ur: NEGATIVE mg/dL

## 2012-04-02 LAB — BLOOD GAS, ARTERIAL
Acid-base deficit: 1.7 mmol/L (ref 0.0–2.0)
Bicarbonate: 22.7 mEq/L (ref 20.0–24.0)
FIO2: 0.21 %
O2 Saturation: 93.9 %
Patient temperature: 98.6
TCO2: 23.9 mmol/L (ref 0–100)

## 2012-04-02 LAB — TYPE AND SCREEN: ABO/RH(D): A POS

## 2012-04-02 LAB — SURGICAL PCR SCREEN: Staphylococcus aureus: NEGATIVE

## 2012-04-02 LAB — PROTIME-INR: Prothrombin Time: 14.7 seconds (ref 11.6–15.2)

## 2012-04-02 SURGERY — ABDOMINAL AORTAGRAM
Anesthesia: LOCAL

## 2012-04-02 MED ORDER — ACETAMINOPHEN 325 MG PO TABS: 650.0000 mg | ORAL_TABLET | ORAL | Status: DC | PRN

## 2012-04-02 MED ORDER — LIDOCAINE HCL (PF) 1 % IJ SOLN
INTRAMUSCULAR | Status: AC
  Filled 2012-04-02: qty 30

## 2012-04-02 MED ORDER — HEPARIN (PORCINE) IN NACL 2-0.9 UNIT/ML-% IJ SOLN
INTRAMUSCULAR | Status: AC
  Filled 2012-04-02: qty 1000

## 2012-04-02 MED ORDER — ONDANSETRON HCL 4 MG/2ML IJ SOLN: 4.0000 mg | Freq: Four times a day (QID) | INTRAMUSCULAR | Status: DC | PRN

## 2012-04-02 MED ORDER — SODIUM CHLORIDE 0.9 % IV SOLN: 1.0000 mL/kg/h | INTRAVENOUS | Status: DC

## 2012-04-02 MED ORDER — FENTANYL CITRATE 0.05 MG/ML IJ SOLN
INTRAMUSCULAR | Status: AC
  Filled 2012-04-02: qty 2

## 2012-04-02 MED ORDER — SODIUM CHLORIDE 0.9 % IV SOLN: INTRAVENOUS | Status: DC

## 2012-04-02 MED ORDER — MIDAZOLAM HCL 2 MG/2ML IJ SOLN
INTRAMUSCULAR | Status: AC
  Filled 2012-04-02: qty 2

## 2012-04-02 NOTE — Op Note (Signed)
PATIENT: [name]   MRN: [id number] DOB: 11-14-1940    DATE OF PROCEDURE: 04/02/2012  INDICATIONS: Jason Henderson is a 71 y.o. male with a 5.5 cm infrarenal AAA. He presents for arteriography in order to plan elective repair.  PROCEDURE:  1. Ultrasound-guided access to the right common femoral artery 2. Aortogram with bilateral iliac arteriogram 3. Perclose Right common femoral artery  SURGEON: Di Kindle. Edilia Bo, MD, FACS  ANESTHESIA: local   EBL: min  TECHNIQUE: The patient was brought to the PV lab and received 1 mg of Versed and 50 mcg of fentanyl. Both groins were prepped and draped in the usual sterile fashion. After the skin was anesthetized with 1% lidocaine, and under ultrasound guidance, the right common femoral artery was cannulated and a guidewire introduced into the infrarenal aorta under fluoroscopic control. A 5 French sheath was introduced over the wire. A pigtail catheter was positioned at the L2 vertebral body and flush aortogram obtained. I also obtained a magnified view of the neck of the aneurysm with a 20 cranial angulation and 10 of LAO. The catheter was positioned above the aortic bifurcation and oblique iliac projections were obtained. I did not obtain runoff films given his mildly elevated creatinine with plans for endovascular aneurysm repair this Friday.  At the completion of the procedure, the 5 French sheath was exchanged for the Perclose device. This was deployed without difficulty with good hemostasis noted.  FINDINGS:  1. Adequate neck for EVAR (3 cm). No accessory renals noted. 2. Patent hypogastric arteries bilaterally. No significant iliac disease bilaterally. 3. The size of the aneurysm cannot be determined by this study.  4. Runoff was not done because of his mildly elevated crt.   [name], MD, FACS [facility]  DATE OF DICTATION:   04/02/2012

## 2012-04-02 NOTE — Pre-Procedure Instructions (Signed)
20 Jason Henderson  04/02/2012   Your procedure is scheduled on:  Friday November 22  Report to [facility] at 5:30 AM.  Call this number if you have problems the morning of surgery: [phone]   Remember:   Do not eat or drink:After Midnight.    Take these medicines the morning of surgery with A SIP OF WATER: Casodex (bicalutamide), Cardura (doxazosin)   Do not wear jewelry, make-up or nail polish.  Do not wear lotions, powders, or perfumes. You may wear deodorant.  Do not shave 48 hours prior to surgery. Men may shave face and neck.  Do not bring valuables to the hospital.  Contacts, dentures or bridgework may not be worn into surgery.  Leave suitcase in the car. After surgery it may be brought to your room.  For patients admitted to the hospital, checkout time is 11:00 AM the day of discharge.   Patients discharged the day of surgery will not be allowed to drive home.  Name and phone number of your driver: NA  Special Instructions: Shower using CHG 2 nights before surgery and the night before surgery.  If you shower the day of surgery use CHG.  Use special wash - you have one bottle of CHG for all showers.  You should use approximately 1/3 of the bottle for each shower.   Please read over the following fact sheets that you were given: Pain Booklet, Coughing and Deep Breathing, Blood Transfusion Information and Surgical Site Infection Prevention

## 2012-04-02 NOTE — H&P (Signed)
[facility] [place]  Patient name: Jason Henderson MRN: [id number] DOB: 03-12-1941 Sex: male  CC: 5.5 cm AAA  HPI: Jason Henderson is a 71 y.o. male who I have been following with an AAA. On his last follow up visit, it had enlarged to 5.5 cm. I have recommended elective repair. He is here today for an arteriogram and his cardiac work up is in progress. He is scheduled for elective EVAR pending these evaluations. No abdominal pain or back pain.    Past Medical History  Diagnosis Date  . Hypertension   . Abdominal aneurysm without mention of rupture   . Hyperlipidemia   . Prostate cancer     Family History  Problem Relation Age of Onset  . Cancer Mother     Lung  . Cancer Father     Colon    SOCIAL HISTORY: History  Substance Use Topics  . Smoking status: Current Every Day Smoker    Types: Cigarettes    Last Attempt to Quit: 02/08/2012  . Smokeless tobacco: Never Used     Comment: He has the patches  . Alcohol Use: No   Still trying to quit tobacco. Has not smoked for 2 days.   No Known Allergies  Current Facility-Administered Medications  Medication Dose Route Frequency Provider Last Rate Last Dose  . 0.9 %  sodium chloride infusion   Intravenous Continuous Chuck Hint, MD      . cefUROXime (ZINACEF) 1.5 g in dextrose 5 % 50 mL IVPB  1.5 g Intravenous 30 min Pre-Op Chuck Hint, MD        REVIEW OF SYSTEMS: Arly.Keller ] denotes positive finding; [  ] denotes negative finding CARDIOVASCULAR:  [ ]  chest pain   [ ]  chest pressure   [ ]  palpitations   [ ]  orthopnea   [ ]  dyspnea on exertion   [ ]  claudication   [ ]  rest pain   [ ]  DVT   [ ]  phlebitis PULMONARY:   [ ]  productive cough   [ ]  asthma   [ ]  wheezing NEUROLOGIC:   [ ]  weakness  [ ]  paresthesias  [ ]  aphasia  [ ]  amaurosis  [ ]  dizziness HEMATOLOGIC:   [ ]  bleeding problems   [ ]  clotting disorders MUSCULOSKELETAL:  [ ]  joint pain   [ ]  joint swelling [ ]  leg  swelling GASTROINTESTINAL: [ ]   blood in stool  [ ]   hematemesis GENITOURINARY:  [ ]   dysuria  [ ]   hematuria PSYCHIATRIC:  [ ]  history of major depression INTEGUMENTARY:  [ ]  rashes  [ ]  ulcers CONSTITUTIONAL:  [ ]  fever   [ ]  chills  PHYSICAL EXAM: Filed Vitals:   04/02/12 0850  BP: 100/53  Pulse: 66  Temp: 98.9 F (37.2 C)  TempSrc: Oral  Resp: 18  Height: 5\' 7"  (1.702 m)  Weight: 223 lb (101.152 kg)  SpO2: 93%   Body mass index is 34.93 kg/(m^2). GENERAL: The patient is a well-nourished male, in no acute distress. The vital signs are documented above. CARDIOVASCULAR: There is a regular rate and rhythm without significant murmur appreciated. I do not detect carotid bruits. He has palpable femoral pulses bilaterally and weakly palpable DP pulses bilaterally.  PULMONARY: There is good air exchange bilaterally without wheezing or rales. ABDOMEN: Soft and non-tender with normal pitched bowel sounds. His AAA is palpable and non tender.  MUSCULOSKELETAL: There are no major deformities or cyanosis. NEUROLOGIC:  No focal weakness or paresthesias are detected. SKIN: There are no ulcers or rashes noted. PSYCHIATRIC: The patient has a normal affect.  DATA:  CT scan 5.5 cm infrarenal AAA. He appears to be a good candidate for EVAR.  MEDICAL ISSUES: The patient is is angiogram today and Endovascular Aneurysm Repair this Friday, pending his cardiac evaluation. I have discussed the indications for aneurysm repair. I have explained that without repair, the risk of the aneurysm rupturing is 5-10% per year. We have discussed the advantages and disadvantages of open versus endovascular repair. The patient wishes to proceed with endovascular aneurysm repair (EVAR). I have discussed the potential complications of EVAR, including, but not limited to: bleeding, infection, arterial injury, graft migration, endoleak, renal failure, MI or other unpredictable medical problems. We have discussed the  possibility of having to convert to open repair. We also discussed the need for continued lifelong follow-up after EVAR. All of the patients questions were answered and they are agreeable to proceed with surgery.    Shawndra Clute S [facility] [place] Office: [phone]

## 2012-04-03 ENCOUNTER — Ambulatory Visit (HOSPITAL_BASED_OUTPATIENT_CLINIC_OR_DEPARTMENT_OTHER): Payer: Medicare Other | Admitting: Radiology

## 2012-04-03 VITALS — BP 117/53 | Ht 67.0 in | Wt 225.0 lb

## 2012-04-03 DIAGNOSIS — I4510 Unspecified right bundle-branch block: Secondary | ICD-10-CM | POA: Diagnosis not present

## 2012-04-03 DIAGNOSIS — I10 Essential (primary) hypertension: Secondary | ICD-10-CM | POA: Insufficient documentation

## 2012-04-03 DIAGNOSIS — I714 Abdominal aortic aneurysm, without rupture, unspecified: Secondary | ICD-10-CM | POA: Insufficient documentation

## 2012-04-03 DIAGNOSIS — R0989 Other specified symptoms and signs involving the circulatory and respiratory systems: Secondary | ICD-10-CM | POA: Insufficient documentation

## 2012-04-03 DIAGNOSIS — R0609 Other forms of dyspnea: Secondary | ICD-10-CM | POA: Insufficient documentation

## 2012-04-03 MED ORDER — TECHNETIUM TC 99M SESTAMIBI GENERIC - CARDIOLITE
10.0000 | Freq: Once | INTRAVENOUS | Status: AC | PRN
  Administered 2012-04-03: 10 via INTRAVENOUS

## 2012-04-03 MED ORDER — TECHNETIUM TC 99M SESTAMIBI GENERIC - CARDIOLITE
30.0000 | Freq: Once | INTRAVENOUS | Status: AC | PRN
  Administered 2012-04-03: 30 via INTRAVENOUS

## 2012-04-03 MED ORDER — REGADENOSON 0.4 MG/5ML IV SOLN
0.4000 mg | Freq: Once | INTRAVENOUS | Status: AC
  Administered 2012-04-03: 0.4 mg via INTRAVENOUS

## 2012-04-03 NOTE — Consult Note (Addendum)
Anesthesia chart review: Patient is a 71 year old male scheduled for EVAR of AAA on 04/06/2012 by Dr. Edilia Bo. 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 Isabel Caprice), chronic renal insufficiency, T&A.  He was seen by cardiologist Dr. Antoine Poche for a preoperative evaluation on 03/29/2012. EKG then showed NSR, incomplete right BBB.  Nuclear stress test was recommended, and this is scheduled for 04/03/2012.  Chest x-ray on 04/02/2012 showed mild hyperinflation with no acute disease.  Pre-operative labs noted including ABG.  Cr 1.39.  Non-fasting glucose 171.  H/H 12.4/37.5.  PT/PTT WNL.  Will order a CBG on arrival to get a fasting glucose level.  I'll follow-up on stress test results when available.  Shonna Chock, PA-C 04/03/12 1100  Addendum: 04/05/12 1100 Nuclear stress test from 04/03/12 showed: Moderate risk study. There is a medium, mild, primarily reversible basal inferolateral and basal to mid inferior perfusion defect. LV Ejection Fraction: 71%. LV Wall Motion: NL LV Function; NL Wall Motion.  Results were reviewed by Dr. Antoine Poche.  His recommendations included, "I have reviewed this very carefully with the patient and reviewed the results with Dr. Shirlee Latch. The patient has had no angina. This is an intermediate risk study only because the size of the defect was on the moderate side while the degree of reversibility was only mild. He can exercise greater than 4 METS on further discussion. The EF was 71%. Given all of this, according to ACC/AHA guidelines, there is no indication for benefit of revascularization so that cath is not indicated. 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 We talked at length about risk reduction and he is going to try to quit smoking today."

## 2012-04-03 NOTE — Progress Notes (Signed)
[facility] SITE 3 NUCLEAR MED [address] [id number] [place] [address] [phone]  Cardiology Nuclear Med Study  Jason Henderson is a 71 y.o. male     MRN : [id number]     DOB: Apr 22, 1941  Procedure Date: 04/03/2012  Nuclear Med Background Indication for Stress Test:  Evaluation for Ischemia, and Surgical Clearance for  AAA repair on 04-06-12 by Dr.Christopher Edilia Bo History:  AAA Cardiac Risk Factors: Hypertension, Lipids, RBBB and Smoker  Symptoms: DOE   Nuclear Pre-Procedure Caffeine/Decaff Intake:  None, sips of caffeine-free diet coke this am NPO After: 10:00am   Lungs:  clear O2 Sat: 95% on room air. IV 0.9% NS with Angio Cath:  22g  IV Site: R Wrist x 1, tolerated well IV Started by:  Irean Hong, RN  Chest Size (in):  48 Cup Size: n/a  Height: 5\' 7"  (1.702 m)  Weight:  225 lb (102.059 kg)  BMI:  Body mass index is 35.24 kg/(m^2). Tech Comments:  Prinivil this am    Nuclear Med Study 1 or 2 day study: 1 day  Stress Test Type:  Lexiscan  Reading MD: Marca Ancona, MD  Order Authorizing Provider:  Rollene Rotunda, MD  Resting Radionuclide: Technetium 39m Sestamibi  Resting Radionuclide Dose: 11.0 mCi   Stress Radionuclide:  Technetium 42m Sestamibi  Stress Radionuclide Dose: 33.0 mCi           Stress Protocol Rest HR: 56 Stress HR: 84  Rest BP: 117/56 Stress BP: 119/55  Exercise Time (min): n/a METS: n/a   Predicted Max HR: 150 bpm % Max HR: 56 bpm Rate Pressure Product: 9996   Dose of Adenosine (mg):  n/a Dose of Lexiscan: 0.4 mg  Dose of Atropine (mg): n/a Dose of Dobutamine: n/a mcg/kg/min (at max HR)  Stress Test Technologist: Milana Na, EMT-P  Nuclear Technologist:  Domenic Polite, CNMT     Rest Procedure:  Myocardial perfusion imaging was performed at rest 45 minutes following the intravenous administration of Technetium 54m Sestamibi Rest ECG: SBradycardia  Stress Procedure:  The patient received IV Lexiscan  0.4 mg over 15-seconds.  Technetium 77m Sestamibi injected at 30-seconds.  There were no significant changes, + sob, and throat tightness with Lexiscan.  Quantitative spect images were obtained after a 45 minute delay. Stress ECG: No significant change from baseline ECG  QPS Raw Data Images:  Normal; no motion artifact; normal heart/lung ratio. Stress Images:  Medium, mild perfusion defect involving the basal inferolateral and the basal to mid inferior wall segments.  Rest Images:  Small, mild basal inferior perfusion defect.  Subtraction (SDS):  Primarily reversible, medium, mild basal inferolateral and basal to mid inferior perfusion defect.  Transient Ischemic Dilatation (Normal <1.22):  0.92 Lung/Heart Ratio (Normal <0.45):  0.34  Quantitative Gated Spect Images QGS EDV:  92 ml QGS ESV:  26 ml  Impression Exercise Capacity:  Lexiscan with no exercise. BP Response:  Normal blood pressure response. Clinical Symptoms:  Dyspnea, throat tightness. ECG Impression:  No significant ST segment change suggestive of ischemia. Comparison with Prior Nuclear Study: No images to compare  Overall Impression:  Moderate risk study.   There is a medium, mild, primarily reversible basal inferolateral and basal to mid inferior perfusion defect.   LV Ejection Fraction: 71%.  LV Wall Motion:  NL LV Function; NL Wall Motion  Marca Ancona 04/03/2012

## 2012-04-04 ENCOUNTER — Telehealth: Payer: Self-pay | Admitting: Cardiology

## 2012-04-04 NOTE — Telephone Encounter (Signed)
Pt needs surgical clearance - had recent lexiscan - will need Dr Antoine Poche to review.

## 2012-04-04 NOTE — Telephone Encounter (Signed)
Pt having intervascular stent graft repair of abdominal aneurysm on 11/22 and they need surgical clearance

## 2012-04-06 ENCOUNTER — Encounter (HOSPITAL_COMMUNITY)
Admission: RE | Disposition: A | Discharge status: Home / Self Care | Payer: Self-pay | Source: Ambulatory Visit | Attending: Vascular Surgery

## 2012-04-06 ENCOUNTER — Other Ambulatory Visit: Payer: Self-pay | Admitting: *Deleted

## 2012-04-06 ENCOUNTER — Inpatient Hospital Stay (HOSPITAL_COMMUNITY)
Admission: RE | Admit: 2012-04-06 | Discharge: 2012-04-07 | DRG: 238 | Disposition: A | Discharge status: Home / Self Care | Payer: Medicare Other | Source: Ambulatory Visit | Attending: Vascular Surgery | Admitting: Vascular Surgery

## 2012-04-06 ENCOUNTER — Inpatient Hospital Stay (HOSPITAL_COMMUNITY): Payer: Medicare Other

## 2012-04-06 ENCOUNTER — Inpatient Hospital Stay (HOSPITAL_COMMUNITY): Payer: Medicare Other | Admitting: Vascular Surgery

## 2012-04-06 ENCOUNTER — Encounter (HOSPITAL_COMMUNITY): Payer: Self-pay | Admitting: Vascular Surgery

## 2012-04-06 DIAGNOSIS — N189 Chronic kidney disease, unspecified: Secondary | ICD-10-CM | POA: Diagnosis present

## 2012-04-06 DIAGNOSIS — I714 Abdominal aortic aneurysm, without rupture, unspecified: Secondary | ICD-10-CM | POA: Diagnosis not present

## 2012-04-06 DIAGNOSIS — Z8546 Personal history of malignant neoplasm of prostate: Secondary | ICD-10-CM

## 2012-04-06 DIAGNOSIS — I129 Hypertensive chronic kidney disease with stage 1 through stage 4 chronic kidney disease, or unspecified chronic kidney disease: Secondary | ICD-10-CM | POA: Diagnosis present

## 2012-04-06 DIAGNOSIS — R0989 Other specified symptoms and signs involving the circulatory and respiratory systems: Secondary | ICD-10-CM | POA: Diagnosis present

## 2012-04-06 DIAGNOSIS — E785 Hyperlipidemia, unspecified: Secondary | ICD-10-CM | POA: Diagnosis present

## 2012-04-06 DIAGNOSIS — R0609 Other forms of dyspnea: Secondary | ICD-10-CM | POA: Diagnosis present

## 2012-04-06 DIAGNOSIS — Z48812 Encounter for surgical aftercare following surgery on the circulatory system: Secondary | ICD-10-CM | POA: Diagnosis not present

## 2012-04-06 DIAGNOSIS — J9819 Other pulmonary collapse: Secondary | ICD-10-CM | POA: Diagnosis not present

## 2012-04-06 DIAGNOSIS — F17200 Nicotine dependence, unspecified, uncomplicated: Secondary | ICD-10-CM | POA: Diagnosis present

## 2012-04-06 HISTORY — PX: ABDOMINAL AORTIC ENDOVASCULAR STENT GRAFT: SHX5707

## 2012-04-06 LAB — CBC
Hemoglobin: 10.1 g/dL — ABNORMAL LOW (ref 13.0–17.0)
RBC: 3.45 MIL/uL — ABNORMAL LOW (ref 4.22–5.81)
WBC: 6.4 10*3/uL (ref 4.0–10.5)

## 2012-04-06 LAB — BASIC METABOLIC PANEL
CO2: 24 mEq/L (ref 19–32)
GFR calc non Af Amer: 48 mL/min — ABNORMAL LOW (ref >90)
Glucose, Bld: 99 mg/dL (ref 70–99)
Potassium: 4.4 mEq/L (ref 3.5–5.1)
Sodium: 137 mEq/L (ref 135–145)

## 2012-04-06 LAB — PROTIME-INR: INR: 1.3 (ref 0.00–1.49)

## 2012-04-06 LAB — APTT: aPTT: 33 seconds (ref 24–37)

## 2012-04-06 SURGERY — INSERTION, ENDOVASCULAR STENT GRAFT, AORTA, ABDOMINAL
Anesthesia: General | Wound class: Clean

## 2012-04-06 MED ORDER — ASPIRIN 81 MG PO TBEC: 81.0000 mg | DELAYED_RELEASE_TABLET | Freq: Every day | ORAL | Status: DC

## 2012-04-06 MED ORDER — CEFAZOLIN SODIUM-DEXTROSE 2-3 GM-% IV SOLR
INTRAVENOUS | Status: DC | PRN
  Administered 2012-04-06: 2 g via INTRAVENOUS

## 2012-04-06 MED ORDER — HYDROMORPHONE HCL PF 1 MG/ML IJ SOLN: 0.2500 mg | INTRAMUSCULAR | Status: DC | PRN

## 2012-04-06 MED ORDER — METOPROLOL TARTRATE 1 MG/ML IV SOLN: 2.0000 mg | INTRAVENOUS | Status: DC | PRN

## 2012-04-06 MED ORDER — FENOFIBRATE 160 MG PO TABS
160.0000 mg | ORAL_TABLET | Freq: Every day | ORAL | Status: DC
  Administered 2012-04-06: 160 mg via ORAL
  Filled 2012-04-06 (×2): qty 1

## 2012-04-06 MED ORDER — HYDRALAZINE HCL 20 MG/ML IJ SOLN: 10.0000 mg | INTRAMUSCULAR | Status: DC | PRN

## 2012-04-06 MED ORDER — ACETAMINOPHEN 325 MG RE SUPP
325.0000 mg | RECTAL | Status: DC | PRN
  Filled 2012-04-06: qty 2

## 2012-04-06 MED ORDER — POTASSIUM CHLORIDE CRYS ER 20 MEQ PO TBCR: 20.0000 meq | EXTENDED_RELEASE_TABLET | Freq: Every day | ORAL | Status: DC | PRN

## 2012-04-06 MED ORDER — PROPOFOL 10 MG/ML IV BOLUS
INTRAVENOUS | Status: DC | PRN
  Administered 2012-04-06: 160 mg via INTRAVENOUS

## 2012-04-06 MED ORDER — SIMVASTATIN 20 MG PO TABS: 20.0000 mg | ORAL_TABLET | Freq: Every day | ORAL | Status: DC

## 2012-04-06 MED ORDER — ONDANSETRON HCL 4 MG/2ML IJ SOLN: 4.0000 mg | Freq: Four times a day (QID) | INTRAMUSCULAR | Status: DC | PRN

## 2012-04-06 MED ORDER — LABETALOL HCL 5 MG/ML IV SOLN: 10.0000 mg | INTRAVENOUS | Status: DC | PRN

## 2012-04-06 MED ORDER — GUAIFENESIN-DM 100-10 MG/5ML PO SYRP: 15.0000 mL | ORAL_SOLUTION | ORAL | Status: DC | PRN

## 2012-04-06 MED ORDER — IODIXANOL 320 MG/ML IV SOLN
INTRAVENOUS | Status: DC | PRN
  Administered 2012-04-06: 150 mL via INTRA_ARTERIAL

## 2012-04-06 MED ORDER — PHENYLEPHRINE HCL 10 MG/ML IJ SOLN
20.0000 mg | INTRAVENOUS | Status: DC | PRN
  Administered 2012-04-06: 50 ug/min via INTRAVENOUS

## 2012-04-06 MED ORDER — ROCURONIUM BROMIDE 100 MG/10ML IV SOLN
INTRAVENOUS | Status: DC | PRN
  Administered 2012-04-06: 50 mg via INTRAVENOUS

## 2012-04-06 MED ORDER — CEFAZOLIN SODIUM-DEXTROSE 2-3 GM-% IV SOLR
INTRAVENOUS | Status: AC
  Filled 2012-04-06: qty 50

## 2012-04-06 MED ORDER — GLYCOPYRROLATE 0.2 MG/ML IJ SOLN
INTRAMUSCULAR | Status: DC | PRN
  Administered 2012-04-06: 0.6 mg via INTRAVENOUS

## 2012-04-06 MED ORDER — ASPIRIN EC 81 MG PO TBEC
81.0000 mg | DELAYED_RELEASE_TABLET | Freq: Every day | ORAL | Status: DC
  Filled 2012-04-06 (×2): qty 1

## 2012-04-06 MED ORDER — ACETAMINOPHEN 325 MG PO TABS: 325.0000 mg | ORAL_TABLET | ORAL | Status: DC | PRN

## 2012-04-06 MED ORDER — LIDOCAINE HCL (CARDIAC) 20 MG/ML IV SOLN
INTRAVENOUS | Status: DC | PRN
  Administered 2012-04-06: 80 mg via INTRAVENOUS

## 2012-04-06 MED ORDER — SODIUM CHLORIDE 0.9 % IV SOLN
500.0000 mL | Freq: Once | INTRAVENOUS | Status: AC | PRN
  Administered 2012-04-06: 500 mL via INTRAVENOUS

## 2012-04-06 MED ORDER — MAGNESIUM SULFATE 40 MG/ML IJ SOLN
2.0000 g | Freq: Once | INTRAMUSCULAR | Status: AC | PRN
  Filled 2012-04-06: qty 50

## 2012-04-06 MED ORDER — MORPHINE SULFATE 2 MG/ML IJ SOLN: 2.0000 mg | INTRAMUSCULAR | Status: DC | PRN

## 2012-04-06 MED ORDER — DOXAZOSIN MESYLATE 1 MG PO TABS
1.0000 mg | ORAL_TABLET | Freq: Every day | ORAL | Status: DC
  Filled 2012-04-06: qty 1

## 2012-04-06 MED ORDER — ARTIFICIAL TEARS OP OINT
TOPICAL_OINTMENT | OPHTHALMIC | Status: DC | PRN
  Administered 2012-04-06: 1 via OPHTHALMIC

## 2012-04-06 MED ORDER — OXYCODONE HCL 5 MG PO TABS: 5.0000 mg | ORAL_TABLET | Freq: Once | ORAL | Status: DC | PRN

## 2012-04-06 MED ORDER — BICALUTAMIDE 50 MG PO TABS
50.0000 mg | ORAL_TABLET | Freq: Every day | ORAL | Status: DC
  Filled 2012-04-06 (×3): qty 1

## 2012-04-06 MED ORDER — 0.9 % SODIUM CHLORIDE (POUR BTL) OPTIME
TOPICAL | Status: DC | PRN
  Administered 2012-04-06: 1000 mL

## 2012-04-06 MED ORDER — SODIUM CHLORIDE 0.9 % IR SOLN
Status: DC | PRN
  Administered 2012-04-06: 08:00:00

## 2012-04-06 MED ORDER — ATORVASTATIN CALCIUM 20 MG PO TABS
20.0000 mg | ORAL_TABLET | Freq: Every day | ORAL | Status: DC
  Administered 2012-04-06: 20 mg via ORAL
  Filled 2012-04-06 (×2): qty 1

## 2012-04-06 MED ORDER — LACTATED RINGERS IV SOLN
INTRAVENOUS | Status: DC | PRN
  Administered 2012-04-06: 07:00:00 via INTRAVENOUS

## 2012-04-06 MED ORDER — ONDANSETRON HCL 4 MG/2ML IJ SOLN
4.0000 mg | Freq: Four times a day (QID) | INTRAMUSCULAR | Status: DC | PRN
  Filled 2012-04-06: qty 2

## 2012-04-06 MED ORDER — DOCUSATE SODIUM 100 MG PO CAPS: 100.0000 mg | ORAL_CAPSULE | Freq: Every day | ORAL | Status: DC

## 2012-04-06 MED ORDER — PHENOL 1.4 % MT LIQD: 1.0000 | OROMUCOSAL | Status: DC | PRN

## 2012-04-06 MED ORDER — DEXTROSE 5 % IV SOLN
1.5000 g | Freq: Two times a day (BID) | INTRAVENOUS | Status: AC
  Administered 2012-04-06 – 2012-04-07 (×2): 1.5 g via INTRAVENOUS
  Filled 2012-04-06 (×2): qty 1.5

## 2012-04-06 MED ORDER — ONDANSETRON HCL 4 MG/2ML IJ SOLN
INTRAMUSCULAR | Status: DC | PRN
  Administered 2012-04-06: 4 mg via INTRAVENOUS

## 2012-04-06 MED ORDER — OXYBUTYNIN CHLORIDE ER 10 MG PO TB24
10.0000 mg | ORAL_TABLET | Freq: Every day | ORAL | Status: DC
  Administered 2012-04-06: 10 mg via ORAL
  Filled 2012-04-06 (×2): qty 1

## 2012-04-06 MED ORDER — SODIUM CHLORIDE 0.9 % IV SOLN: INTRAVENOUS | Status: DC

## 2012-04-06 MED ORDER — SODIUM CHLORIDE 0.9 % IV SOLN
Freq: Once | INTRAVENOUS | Status: AC
  Administered 2012-04-06: 11:00:00 via INTRAVENOUS

## 2012-04-06 MED ORDER — OXYCODONE-ACETAMINOPHEN 5-325 MG PO TABS: 1.0000 | ORAL_TABLET | ORAL | Status: DC | PRN

## 2012-04-06 MED ORDER — FENTANYL CITRATE 0.05 MG/ML IJ SOLN
INTRAMUSCULAR | Status: DC | PRN
  Administered 2012-04-06 (×5): 50 ug via INTRAVENOUS

## 2012-04-06 MED ORDER — HEPARIN SODIUM (PORCINE) 1000 UNIT/ML IJ SOLN
INTRAMUSCULAR | Status: DC | PRN
  Administered 2012-04-06: 10000 [IU] via INTRAVENOUS

## 2012-04-06 MED ORDER — LISINOPRIL 10 MG PO TABS
10.0000 mg | ORAL_TABLET | Freq: Every day | ORAL | Status: DC
  Filled 2012-04-06: qty 1

## 2012-04-06 MED ORDER — NEOSTIGMINE METHYLSULFATE 1 MG/ML IJ SOLN
INTRAMUSCULAR | Status: DC | PRN
  Administered 2012-04-06: 5 mg via INTRAVENOUS

## 2012-04-06 MED ORDER — PROTAMINE SULFATE 10 MG/ML IV SOLN
INTRAVENOUS | Status: DC | PRN
  Administered 2012-04-06: 40 mg via INTRAVENOUS

## 2012-04-06 MED ORDER — LISINOPRIL 10 MG PO TABS: 10.0000 mg | ORAL_TABLET | Freq: Every day | ORAL | Status: DC

## 2012-04-06 MED ORDER — OXYCODONE HCL 5 MG/5ML PO SOLN: 5.0000 mg | Freq: Once | ORAL | Status: DC | PRN

## 2012-04-06 MED ORDER — SODIUM CHLORIDE 0.9 % IV SOLN
INTRAVENOUS | Status: DC
  Administered 2012-04-06: 11:00:00 via INTRAVENOUS

## 2012-04-06 MED ORDER — DOPAMINE-DEXTROSE 3.2-5 MG/ML-% IV SOLN: 3.0000 ug/kg/min | INTRAVENOUS | Status: DC | PRN

## 2012-04-06 SURGICAL SUPPLY — 79 items
BAG BANDED W/RUBBER/TAPE 36X54 (MISCELLANEOUS) ×2 IMPLANT
BAG DECANTER FOR FLEXI CONT (MISCELLANEOUS) IMPLANT
BAG SNAP BAND KOVER 36X36 (MISCELLANEOUS) ×2 IMPLANT
BALLN CODA OCL 2-9.0-35-120-3 (BALLOONS)
BALLOON COD OCL 2-9.0-35-120-3 (BALLOONS) IMPLANT
CANISTER SUCTION 2500CC (MISCELLANEOUS) ×2 IMPLANT
CATH BEACON 5.038 65CM KMP-01 (CATHETERS) ×2 IMPLANT
CATH OMNI FLUSH .035X70CM (CATHETERS) ×2 IMPLANT
CLIP TI MEDIUM 24 (CLIP) IMPLANT
CLIP TI WIDE RED SMALL 24 (CLIP) IMPLANT
CLOTH BEACON ORANGE TIMEOUT ST (SAFETY) ×2 IMPLANT
COVER DOME SNAP 22 D (MISCELLANEOUS) ×2 IMPLANT
COVER MAYO STAND STRL (DRAPES) ×2 IMPLANT
COVER PROBE W GEL 5X96 (DRAPES) ×2 IMPLANT
COVER SURGICAL LIGHT HANDLE (MISCELLANEOUS) ×2 IMPLANT
DERMABOND ADVANCED (GAUZE/BANDAGES/DRESSINGS) ×2
DERMABOND ADVANCED .7 DNX12 (GAUZE/BANDAGES/DRESSINGS) ×2 IMPLANT
DEVICE CLOSURE PERCLS PRGLD 6F (VASCULAR PRODUCTS) ×4 IMPLANT
DEVICE TORQUE KENDALL .025-038 (MISCELLANEOUS) ×2 IMPLANT
DRAIN CHANNEL 10F 3/8 F FF (DRAIN) IMPLANT
DRAIN CHANNEL 10M FLAT 3/4 FLT (DRAIN) IMPLANT
DRAPE TABLE COVER HEAVY DUTY (DRAPES) ×2 IMPLANT
DRESSING OPSITE X SMALL 2X3 (GAUZE/BANDAGES/DRESSINGS) ×4 IMPLANT
DRYSEAL FLEXSHEATH 12FR 33CM (SHEATH) ×1
DRYSEAL FLEXSHEATH 18FR 33CM (SHEATH) ×1
ELECT CAUTERY BLADE 6.4 (BLADE) ×2 IMPLANT
ELECT REM PT RETURN 9FT ADLT (ELECTROSURGICAL) ×4
ELECTRODE REM PT RTRN 9FT ADLT (ELECTROSURGICAL) ×2 IMPLANT
EVACUATOR 3/16  PVC DRAIN (DRAIN)
EVACUATOR 3/16 PVC DRAIN (DRAIN) IMPLANT
EVACUATOR SILICONE 100CC (DRAIN) IMPLANT
EXCLUDER TRUNK (Endovascular Graft) ×2 IMPLANT
GAUZE SPONGE 2X2 8PLY STRL LF (GAUZE/BANDAGES/DRESSINGS) ×1 IMPLANT
GLOVE BIO SURGEON STRL SZ 6.5 (GLOVE) ×2 IMPLANT
GLOVE BIO SURGEON STRL SZ7.5 (GLOVE) ×2 IMPLANT
GLOVE BIOGEL PI IND STRL 6.5 (GLOVE) ×2 IMPLANT
GLOVE BIOGEL PI IND STRL 7.0 (GLOVE) ×1 IMPLANT
GLOVE BIOGEL PI IND STRL 8 (GLOVE) ×1 IMPLANT
GLOVE BIOGEL PI INDICATOR 6.5 (GLOVE) ×2
GLOVE BIOGEL PI INDICATOR 7.0 (GLOVE) ×1
GLOVE BIOGEL PI INDICATOR 8 (GLOVE) ×1
GLOVE SURG SS PI 7.0 STRL IVOR (GLOVE) ×4 IMPLANT
GOWN STRL NON-REIN LRG LVL3 (GOWN DISPOSABLE) ×4 IMPLANT
GOWN STRL REIN XL XLG (GOWN DISPOSABLE) ×4 IMPLANT
GRAFT BALLN CATH 65CM (STENTS) ×1 IMPLANT
GUIDEWIRE ANGLED .035X150CM (WIRE) ×2 IMPLANT
KIT BASIN OR (CUSTOM PROCEDURE TRAY) ×2 IMPLANT
KIT ROOM TURNOVER OR (KITS) ×2 IMPLANT
LEG CONTRALATERAL 16X20X13.5 (Vascular Products) ×1 IMPLANT
LEG CONTRALATERAL 16X20X9.5 (Endovascular Graft) ×1 IMPLANT
NEEDLE PERC 18GX7CM (NEEDLE) ×2 IMPLANT
NS IRRIG 1000ML POUR BTL (IV SOLUTION) ×2 IMPLANT
PACK AORTA (CUSTOM PROCEDURE TRAY) ×2 IMPLANT
PAD ARMBOARD 7.5X6 YLW CONV (MISCELLANEOUS) ×4 IMPLANT
PENCIL BUTTON HOLSTER BLD 10FT (ELECTRODE) IMPLANT
PERCLOSE PROGLIDE 6F (VASCULAR PRODUCTS) ×8
SHEATH DRYSEAL FLEX 12FR 33CM (SHEATH) ×1 IMPLANT
SHEATH DRYSEAL FLEX 18FR 33CM (SHEATH) ×1 IMPLANT
SPONGE GAUZE 2X2 STER 10/PKG (GAUZE/BANDAGES/DRESSINGS) ×1
STAPLER VISISTAT 35W (STAPLE) IMPLANT
STENT GRAFT BALLN CATH 65CM (STENTS) ×1
STENT GRAFT CONTRALAT 16X20X9. (Endovascular Graft) ×1 IMPLANT
STENT GRAFT CONTRALAT 20X13.5 (Vascular Products) ×1 IMPLANT
STOPCOCK MORSE 400PSI 3WAY (MISCELLANEOUS) ×2 IMPLANT
SUT PROLENE 5 0 C 1 24 (SUTURE) IMPLANT
SUT VIC AB 2-0 CTB1 (SUTURE) IMPLANT
SUT VIC AB 3-0 SH 27 (SUTURE)
SUT VIC AB 3-0 SH 27X BRD (SUTURE) IMPLANT
SUT VICRYL 4-0 PS2 18IN ABS (SUTURE) ×4 IMPLANT
SYR 20CC LL (SYRINGE) ×4 IMPLANT
SYR 30ML LL (SYRINGE) IMPLANT
SYR MEDRAD MARK V 150ML (SYRINGE) ×2 IMPLANT
SYRINGE 10CC LL (SYRINGE) ×6 IMPLANT
TOWEL OR 17X24 6PK STRL BLUE (TOWEL DISPOSABLE) ×6 IMPLANT
TOWEL OR 17X26 10 PK STRL BLUE (TOWEL DISPOSABLE) ×2 IMPLANT
TRAY FOLEY CATH 14FRSI W/METER (CATHETERS) ×2 IMPLANT
TUBING HIGH PRESSURE 120CM (CONNECTOR) ×2 IMPLANT
WIRE AMPLATZ SS-J .035X180CM (WIRE) ×4 IMPLANT
WIRE BENTSON .035X145CM (WIRE) ×4 IMPLANT

## 2012-04-06 NOTE — Discharge Summary (Signed)
[facility] Discharge Summary   Patient ID:  Jason Henderson MRN: [id number] DOB/AGE: 1940/12/04 71 y.o.  Admit date: 04/06/2012 Discharge date: 04/07/12 Date of Surgery: 04/06/2012 Surgeon: Surgeon(s): Chuck Hint, MD  Admission Diagnosis: AAA  Discharge Diagnoses:  AAA  Secondary Diagnoses: Past Medical History  Diagnosis Date  . Hypertension   . Abdominal aneurysm without mention of rupture   . Hyperlipidemia   . Prostate cancer   . Chronic kidney disease     renal insufficiency R side, stent placement    Procedure(s): ABDOMINAL AORTIC ENDOVASCULAR STENT GRAFT Device:    Discharged Condition: good   HPI:  Jason Henderson is a 71 y.o. male who I have been following with an AAA. On his last follow up visit, it had enlarged to 5.5 cm. I have recommended elective repair. He is here today for an arteriogram and his cardiac work up is in progress. He is scheduled for elective EVAR. CT scan 5.5 cm infrarenal AAA. He appears to be a good candidate for EVAR. Marland Kitchen No abdominal pain or back pain.    Hospital Course:  Jason Henderson is a 71 y.o. male is S/P Procedure(s): ABDOMINAL AORTIC ENDOVASCULAR STENT GRAFT Extubated: POD # 0 Post-op wounds healing well Pt. Ambulating, voiding and taking PO diet without difficulty. Pt pain controlled with PO pain meds. Labs as below Complications:none  Consults:     Significant Diagnostic Studies: CBC Lab Results  Component Value Date   WBC 7.2 04/02/2012   HGB 12.4* 04/02/2012   HCT 37.5* 04/02/2012   MCV 90.6 04/02/2012   PLT 248 04/02/2012    BMET    Component Value Date/Time   NA 136 04/02/2012 1317   NA 137 03/23/2012 1140   K 4.0 04/02/2012 1317   K 4.4 03/23/2012 1140   CL 103 04/02/2012 1317   CL 105 03/23/2012 1140   CO2 22 04/02/2012 1317   CO2 24 03/23/2012 1140   GLUCOSE 171* 04/02/2012 1317   GLUCOSE 81 03/23/2012 1140   BUN 33* 04/02/2012 1317   BUN 27.0* 03/23/2012 1140    CREATININE 1.39* 04/02/2012 1317   CREATININE 1.6* 03/23/2012 1140   CREATININE 1.60* 02/06/2012 1553   CALCIUM 9.9 04/02/2012 1317   CALCIUM 10.7* 03/23/2012 1140   GFRNONAA 50* 04/02/2012 1317   GFRAA 58* 04/02/2012 1317   COAG Lab Results  Component Value Date   INR 1.17 04/02/2012   INR 1.1 03/26/2007     Disposition:  Discharge to :Home Discharge Orders    Future Appointments: Provider: Department: Dept Phone: Center:   11/02/2012 11:30 AM Krista Blue [facility] [phone] None   11/02/2012 12:00 PM Benjiman Core, MD [facility] [phone] None     Future Orders Please Complete By Expires   Resume previous diet      Driving Restrictions      Comments:   No driving for 3 weeks   Lifting restrictions      Comments:   No lifting for 4 weeks   Call MD for:  temperature >100.5      Call MD for:  redness, tenderness, or signs of infection (pain, swelling, bleeding, redness, odor or green/yellow discharge around incision site)      Call MD for:  severe or increased pain, loss or decreased feeling  in affected limb(s)      Increase activity slowly      Comments:  Walk with assistance use walker or cane as needed   May shower       Scheduling Instructions:   Sunday   Remove dressing in 24 hours      ABDOMINAL PROCEDURE/ANEURYSM REPAIR/AORTO-BIFEMORAL BYPASS:  Call MD for increased abdominal pain; cramping diarrhea; nausea/vomiting         Jason Henderson, Jason Henderson  Home Medication Instructions ZOX:[id number]   Printed on:04/06/12 0959  Medication Information                    bicalutamide (CASODEX) 50 MG tablet Take 50 mg by mouth daily.            doxazosin (CARDURA) 2 MG tablet Take 1 mg by mouth daily.            fenofibrate 160 MG tablet Take 160 mg by mouth daily.             simvastatin (ZOCOR) 40 MG tablet Take 20 mg by mouth at bedtime.            aspirin 81 MG EC tablet Take 81 mg by  mouth daily.             Cholecalciferol (VITAMIN D3) 2000 UNITS TABS Take 2,000 Int'l Units by mouth. Alternates 1 tablet one day, then 2 tablets the next day           Calcium Carbonate-Vitamin D (CALTRATE 600+D PO) Take 1 tablet by mouth daily.           lisinopril (PRINIVIL,ZESTRIL) 20 MG tablet Take 10 mg by mouth daily.           oxybutynin (DITROPAN-XL) 10 MG 24 hr tablet Take 10 mg by mouth at bedtime.           oxyCODONE-acetaminophen (ROXICET) 5-325 MG per tablet Take 1 tablet by mouth every 4 (four) hours as needed for pain.            Verbal and written Discharge instructions given to the patient. Wound care per Discharge AVS   Signed: Marlowe Shores 04/06/2012, 9:59 AM

## 2012-04-06 NOTE — Anesthesia Preprocedure Evaluation (Addendum)
Anesthesia Evaluation  Patient identified by MRN, date of birth, ID band Patient awake    Reviewed: Allergy & Precautions, H&P , NPO status , Patient's Chart, lab work & pertinent test results  Airway Mallampati: II TM Distance: >3 FB Neck ROM: Full    Dental  (+) Teeth Intact and Dental Advisory Given   Pulmonary          Cardiovascular hypertension, Pt. on medications     Neuro/Psych    GI/Hepatic   Endo/Other    Renal/GU Renal InsufficiencyRenal diseasePt denies renal disease. Has has 2 stents placed in Left kidney     Musculoskeletal   Abdominal   Peds  Hematology   Anesthesia Other Findings   Reproductive/Obstetrics                          Anesthesia Physical Anesthesia Plan  ASA: III  Anesthesia Plan: General   Post-op Pain Management:    Induction: Intravenous  Airway Management Planned: Oral ETT  Additional Equipment: Arterial line and CVP  Intra-op Plan:   Post-operative Plan: Extubation in OR  Informed Consent: I have reviewed the patients History and Physical, chart, labs and discussed the procedure including the risks, benefits and alternatives for the proposed anesthesia with the patient or authorized representative who has indicated his/her understanding and acceptance.     Plan Discussed with: CRNA and Surgeon  Anesthesia Plan Comments:         Anesthesia Quick Evaluation

## 2012-04-06 NOTE — Interval H&P Note (Signed)
History and Physical Interval Note:  04/06/2012 6:58 AM  Jason Henderson  has presented today for surgery, with the diagnosis of AAA  The various methods of treatment have been discussed with the patient and family. After consideration of risks, benefits and other options for treatment, the patient has consented to  Procedure(s) (LRB) with comments: ABDOMINAL AORTIC ENDOVASCULAR STENT GRAFT (N/A) - GORE as a surgical intervention .  The patient's history has been reviewed, patient examined, no change in status, stable for surgery.  I have reviewed the patient's chart and labs.  Questions were answered to the patient's satisfaction.     [name]

## 2012-04-06 NOTE — Op Note (Signed)
NAME: Jason Henderson   MRN: [id number] DOB: Mar 27, 1941    DATE OF OPERATION: 04/06/2012  PREOP DIAGNOSIS: 5.7 cm infrarenal abdominal aortic aneurysm  POSTOP DIAGNOSIS: Same  PROCEDURE: Percutaneous Endovascular aneurysm repair using 3 components (Gore Excluder)  SURGEON: Di Kindle. Edilia Bo, MD, FACS  ASSIST: Della Goo,  PA  ANESTHESIA: Gen.   EBL: Minimal  INDICATIONS: Jason Henderson is a 71 y.o. male who presents for elective repair of a 5.7 cm infrarenal abdominal aortic aneurysm.  FINDINGS: Postoperative x-ray shows no evidence of type I or type II endoleak.   TECHNIQUE: Patient was brought to the operating room after monitoring lines were placed by anesthesia. The patient received a general anesthetic. The abdomen and groins were prepped and draped in usual sternal fashion. Under ultrasound guidance, the right common femoral artery was visualized and cannulated under direct vision. A guidewire was introduced over the wire and the artery was free closed using 2 Perclose devices. The initial device was rotated 30 medially, and the second device was rotated 30 laterally. A 8 French sheath was then introduced over the wire and the Bentson wire was exchanged for an Amplatz wire using a pigtail catheter. Likewise, the left common femoral artery was free closed with 2 Perclose devices. Again, under ultrasound guidance the left common femoral artery was cannulated and then after the artery was free closed, a long 8 French sheath was introduced over the wire. The pigtail catheter was the left side for aortogram. A 18 French sheath was advanced over the Amplatz wire the right groin and positioned in the infrarenal aorta. The trunk ipsilateral component was a 28 mm x 14 mm x 12 cm device. This was advanced over the Amplatz wire up the right side and positioned at the level of the renal arteries. Contralateral gate was positioned on the patient's right slightly also anteriorly per  aortogram was then obtained to demonstrate the position of the renal arteries. The trunk ipsilateral component was brought down just below the renal arteries and deployed without difficulty. The contralateral gate was opened.  Next the contralateral limb was cannulated using a the catheter and an angled Glidewire. We confirmed that we were in the body of the graft using the pigtail catheter. Next the Amplatz wire was advanced through the pigtail catheter up the contralateral limb. The 8 French sheath was exchanged for the 12 [place] sheath. The contralateral limb was a 20 mm x 13.5 cm device. I had done a retrograde iliac arteriogram to demonstrate position of the internal iliac artery on the left. The contralateral limb was then deployed down to above the level of the hypogastric artery on the left. Next ipsilateral limb of the trunk ipsilateral component was fully deployed. A retrograde right iliac arteriogram was obtained in order to demonstrate position of the right hypogastric artery. The extension limb on the ipsilateral side was selected. This was a 20 mm x 9.5 cm in length device. This was positioned so that it would gland above the level of the right hypogastric artery. The sheath was retracted and the extension limb deployed without difficulty. I then ballooned the proximal graft and all areas of overlap using a aortic balloon. Next the pigtail catheter was positioned just above the renals and a completion arteriogram was obtained. This demonstrated no evidence of type I, type II, or type III endoleak.  The left femoral artery was closed initially. The sutures were secured there was good hemostasis. The wire was removed and the sutures were  tied. There was good hemostasis. The sutures were cut. Likewise, on the right side the sutures were secured after the sheath was removed and there was good hemostasis. The wire was removed and the sutures were tied. There was good hemostasis. He had warm  well-perfused feet at the completion. No immediate complications were noted.  [name], MD, FACS [facility]  DATE OF DICTATION:   04/06/2012

## 2012-04-06 NOTE — Progress Notes (Signed)
ANTIBIOTIC CONSULT NOTE - INITIAL  Pharmacy Consult for may adjust antibiotics for renal function Indication: Post-op prophylaxis  No Known Allergies  Patient Measurements: Height: 5\' 7"  (170.2 cm) Weight: 232 lb 9.4 oz (105.5 kg) IBW/kg (Calculated) : 66.1  Adjusted Body Weight: n/a  Vital Signs: Temp: 97.6 F (36.4 C) (11/22 1300) Temp src: Oral (11/22 1300) BP: 116/61 mmHg (11/22 1300) Pulse Rate: 57  (11/22 1209) Intake/Output from previous day:   Intake/Output from this shift: Total I/O In: 1000 [I.V.:1000] Out: 500 [Urine:425; Blood:75]  Labs:  [facility] 04/06/12 1030  WBC 6.4  HGB 10.1*  PLT 192  LABCREA --  CREATININE 1.44*   Estimated Creatinine Clearance: 55.3 ml/min (by C-G formula based on Cr of 1.44). No results found for this basename: VANCOTROUGH:2,VANCOPEAK:2,VANCORANDOM:2,GENTTROUGH:2,GENTPEAK:2,GENTRANDOM:2,TOBRATROUGH:2,TOBRAPEAK:2,TOBRARND:2,AMIKACINPEAK:2,AMIKACINTROU:2,AMIKACIN:2, in the last 72 hours   Microbiology: Recent Results (from the past 720 hour(s))  SURGICAL PCR SCREEN     Status: Normal   Collection Time   04/02/12 12:45 PM      Component Value Range Status Comment   MRSA, PCR NEGATIVE  NEGATIVE Final    Staphylococcus aureus NEGATIVE  NEGATIVE Final     Medical History: Past Medical History  Diagnosis Date  . Hypertension   . Abdominal aneurysm without mention of rupture   . Hyperlipidemia   . Prostate cancer   . Chronic kidney disease     renal insufficiency R side, stent placement    Medications:  Scheduled:    . [COMPLETED] sodium chloride   Intravenous Once  . aspirin EC  81 mg Oral Daily  . atorvastatin  20 mg Oral q1800  . bicalutamide  50 mg Oral Daily  . cefUROXime (ZINACEF)  IV  1.5 g Intravenous Q12H  . docusate sodium  100 mg Oral Daily  . doxazosin  1 mg Oral Daily  . fenofibrate  160 mg Oral Daily  . lisinopril  10 mg Oral Daily  . oxybutynin  10 mg Oral QHS  . [DISCONTINUED] aspirin  81 mg Oral  Daily  . [DISCONTINUED] simvastatin  20 mg Oral QHS   Assessment: 69 male s/p AAA repair with orders for 2 doses of post-op Zinacef.  Doses are appropriate for his renal function.  Est. CrCl ~ 55 ml/min.  Goal of Therapy:  Prevention of post-op infection  Plan:  1. Continue Zinacef as ordered, 2. Pharmacy will sign off,  Thanks!  Jovanne Riggenbach C 04/06/2012,2:23 PM

## 2012-04-06 NOTE — H&P (View-Only) (Signed)
[facility] [place]  Patient name: Jason Henderson MRN: [id number] DOB: 08/09/1940 Sex: male  CC: 5.5 cm AAA  HPI: [name] is a 71 y.o. male who I have been following with an AAA. On his last follow up visit, it had enlarged to 5.5 cm. I have recommended elective repair. He is here today for an arteriogram and his cardiac work up is in progress. He is scheduled for elective EVAR pending these evaluations. No abdominal pain or back pain.    Past Medical History  Diagnosis Date  . Hypertension   . Abdominal aneurysm without mention of rupture   . Hyperlipidemia   . Prostate cancer     Family History  Problem Relation Age of Onset  . Cancer Mother     Lung  . Cancer Father     Colon    SOCIAL HISTORY: History  Substance Use Topics  . Smoking status: Current Every Day Smoker    Types: Cigarettes    Last Attempt to Quit: 02/08/2012  . Smokeless tobacco: Never Used     Comment: He has the patches  . Alcohol Use: No   Still trying to quit tobacco. Has not smoked for 2 days.   No Known Allergies  Current Facility-Administered Medications  Medication Dose Route Frequency Provider Last Rate Last Dose  . 0.9 %  sodium chloride infusion   Intravenous Continuous Christopher S Dickson, MD      . cefUROXime (ZINACEF) 1.5 g in dextrose 5 % 50 mL IVPB  1.5 g Intravenous 30 min Pre-Op Christopher S Dickson, MD        REVIEW OF SYSTEMS: [X ] denotes positive finding; [  ] denotes negative finding CARDIOVASCULAR:  [ ] chest pain   [ ] chest pressure   [ ] palpitations   [ ] orthopnea   [ ] dyspnea on exertion   [ ] claudication   [ ] rest pain   [ ] DVT   [ ] phlebitis PULMONARY:   [ ] productive cough   [ ] asthma   [ ] wheezing NEUROLOGIC:   [ ] weakness  [ ] paresthesias  [ ] aphasia  [ ] amaurosis  [ ] dizziness HEMATOLOGIC:   [ ] bleeding problems   [ ] clotting disorders MUSCULOSKELETAL:  [ ] joint pain   [ ] joint swelling [ ] leg  swelling GASTROINTESTINAL: [ ]  blood in stool  [ ]  hematemesis GENITOURINARY:  [ ]  dysuria  [ ]  hematuria PSYCHIATRIC:  [ ] history of major depression INTEGUMENTARY:  [ ] rashes  [ ] ulcers CONSTITUTIONAL:  [ ] fever   [ ] chills  PHYSICAL EXAM: Filed Vitals:   04/02/12 0850  BP: 100/53  Pulse: 66  Temp: 98.9 F (37.2 C)  TempSrc: Oral  Resp: 18  Height: 5' 7" (1.702 m)  Weight: 223 lb (101.152 kg)  SpO2: 93%   Body mass index is 34.93 kg/(m^2). GENERAL: The patient is a well-nourished male, in no acute distress. The vital signs are documented above. CARDIOVASCULAR: There is a regular rate and rhythm without significant murmur appreciated. I do not detect carotid bruits. He has palpable femoral pulses bilaterally and weakly palpable DP pulses bilaterally.  PULMONARY: There is good air exchange bilaterally without wheezing or rales. ABDOMEN: Soft and non-tender with normal pitched bowel sounds. His AAA is palpable and non tender.  MUSCULOSKELETAL: There are no major deformities or cyanosis. NEUROLOGIC:   No focal weakness or paresthesias are detected. SKIN: There are no ulcers or rashes noted. PSYCHIATRIC: The patient has a normal affect.  DATA:  CT scan 5.5 cm infrarenal AAA. He appears to be a good candidate for EVAR.  MEDICAL ISSUES: The patient is is angiogram today and Endovascular Aneurysm Repair this Friday, pending his cardiac evaluation. I have discussed the indications for aneurysm repair. I have explained that without repair, the risk of the aneurysm rupturing is 5-10% per year. We have discussed the advantages and disadvantages of open versus endovascular repair. The patient wishes to proceed with endovascular aneurysm repair (EVAR). I have discussed the potential complications of EVAR, including, but not limited to: bleeding, infection, arterial injury, graft migration, endoleak, renal failure, MI or other unpredictable medical problems. We have discussed the  possibility of having to convert to open repair. We also discussed the need for continued lifelong follow-up after EVAR. All of the patients questions were answered and they are agreeable to proceed with surgery.    DICKSON,CHRISTOPHER S [facility] [place] Office: [phone]   

## 2012-04-06 NOTE — Progress Notes (Signed)
[facility]ASC[facility] OF [place]  Post-op Check - EVAR Date of Surgery: 04/06/2012 Surgeon: Surgeon(s): Chuck Hint, MD POD: Day of Surgery History of Present Illness  Jason Henderson is a 71 y.o. male who is s/p EVAR. The patient denies back pain; denies abdominal pain; denies lower extremity pain.    IMAGING: Dg Chest Portable 1 View  04/06/2012  *RADIOLOGY REPORT*  Clinical Data: Stent graft placement  PORTABLE CHEST - 1 VIEW  Comparison: 04/02/2012  Findings: Low lung volumes with vascular crowding and bibasilar atelectasis. No pleural effusion or pneumothorax.  Mild patchy medial left upper lobe opacity, infection not excluded.  No pleural effusion or pneumothorax.  Apparent patchy/nodular opacity at the left lung apex is likely osseous (related to the left anterior first rib).  The heart is top normal in size.  Right IJ venous sheath.  IMPRESSION: Low lung volumes with vascular crowding and bibasilar atelectasis.  Mild patchy medial left upper lobe opacity, infection not excluded. Consider follow-up PA/lateral chest radiographs.   Original Report Authenticated By: Charline Bills, M.D.    Dg Abd Portable 1v  04/06/2012  *RADIOLOGY REPORT*  Clinical Data: Stent graft placement  PORTABLE ABDOMEN - 1 VIEW  Comparison: CT 02/08/2012  Findings: The aorta iliac stent graft in satisfactory position.  It is difficult to clearly identify the upper margin of the stent but is probably at the L1 level.  Stent graft  extends into the proximal iliac arteries.  Normal bowel gas pattern.  Left femoral vascular catheter is present.  IMPRESSION: Aorta iliac stent graft.   Original Report Authenticated By: Janeece Riggers, M.D.     Significant Diagnostic Studies: CBC Lab Results  Component Value Date   WBC 6.4 04/06/2012   HGB 10.1* 04/06/2012   HCT 31.0* 04/06/2012   MCV 89.9 04/06/2012   PLT 192 04/06/2012     BMET    Component Value Date/Time   NA 137 04/06/2012 1030   NA  137 03/23/2012 1140   K 4.4 04/06/2012 1030   K 4.4 03/23/2012 1140   CL 106 04/06/2012 1030   CL 105 03/23/2012 1140   CO2 24 04/06/2012 1030   CO2 24 03/23/2012 1140   GLUCOSE 99 04/06/2012 1030   GLUCOSE 81 03/23/2012 1140   BUN 31* 04/06/2012 1030   BUN 27.0* 03/23/2012 1140   CREATININE 1.44* 04/06/2012 1030   CREATININE 1.6* 03/23/2012 1140   CREATININE 1.60* 02/06/2012 1553   CALCIUM 8.4 04/06/2012 1030   CALCIUM 10.7* 03/23/2012 1140   GFRNONAA 48* 04/06/2012 1030   GFRAA 55* 04/06/2012 1030    COAG Lab Results  Component Value Date   INR 1.30 04/06/2012   INR 1.17 04/02/2012   INR 1.1 03/26/2007   No results found for this basename: PTT       No data found.   Physical Examination  BP Readings from Last 3 Encounters:  04/06/12 95/43  04/06/12 95/43  04/03/12 117/53   Temp Readings from Last 3 Encounters:  04/06/12 97.9 F (36.6 C) Oral  04/06/12 97.9 F (36.6 C) Oral  04/02/12 98 F (36.7 C) Oral   SpO2 Readings from Last 3 Encounters:  04/06/12 100%  04/06/12 100%  04/02/12 94%   Pulse Readings from Last 3 Encounters:  04/06/12 57  04/06/12 57  04/02/12 74    General: A&O x 3, WDWN male in NAD  Pulmonary: normal non-labored breathing  Cardiac: RRR Abdomen: soft, NT, NABS Bilateral groin wounds: clean, dry, intact, without  hematoma Vascular Exam/Pulses:palp DP bilat  Extremities without ischemic changes, no Gangrene , no cellulitis; no open wounds;   Neurologic: A&O X 3; Appropriate Affect  Assessment: Jason Henderson is a 71 y.o. male who is S/P EVAR.  Pt is doing well with no complaints Hypotension stable  Plan: Home in am if remains stable  The importance of surveillance of the endograft was discussed with the patient  A CTA of abdomen and pelvis will be scheduled for one month to assess for endoleak.  The patient will follow up with [place] in one month with these studies.   SignedMarlowe Shores [phone] 04/06/2012 5:28  PM.

## 2012-04-06 NOTE — Transfer of Care (Signed)
Immediate Anesthesia Transfer of Care Note  Patient: Jason Henderson  Procedure(s) Performed: Procedure(s) (LRB) with comments: ABDOMINAL AORTIC ENDOVASCULAR STENT GRAFT (N/A) - GORE; ultrasound guided.  Patient Location: PACU  Anesthesia Type:General  Level of Consciousness: awake, alert , oriented and patient cooperative  Airway & Oxygen Therapy: Patient Spontanous Breathing and Patient connected to nasal cannula oxygen  Post-op Assessment: Report given to PACU RN, Post -op Vital signs reviewed and stable and Patient moving all extremities X 4  Post vital signs: Reviewed and stable  Complications: No apparent anesthesia complications

## 2012-04-06 NOTE — Anesthesia Postprocedure Evaluation (Signed)
  Anesthesia Post-op Note  Patient: Jason Henderson  Procedure(s) Performed: Procedure(s) (LRB) with comments: ABDOMINAL AORTIC ENDOVASCULAR STENT GRAFT (N/A) - GORE; ultrasound guided.  Patient Location: PACU  Anesthesia Type:General  Level of Consciousness: awake, alert  and oriented  Airway and Oxygen Therapy: Patient Spontanous Breathing and Patient connected to nasal cannula oxygen  Post-op Pain: none  Post-op Assessment: Post-op Vital signs reviewed, Patient's Cardiovascular Status Stable, Respiratory Function Stable, Patent Airway and No signs of Nausea or vomiting  Post-op Vital Signs: Reviewed and stable  Complications: No apparent anesthesia complications

## 2012-04-06 NOTE — Progress Notes (Signed)
Utilization review completed.  

## 2012-04-06 NOTE — Telephone Encounter (Signed)
This has been completed and documented on the lexiscan report

## 2012-04-07 LAB — BASIC METABOLIC PANEL
BUN: 24 mg/dL — ABNORMAL HIGH (ref 6–23)
Chloride: 108 mEq/L (ref 96–112)
Creatinine, Ser: 1.53 mg/dL — ABNORMAL HIGH (ref 0.50–1.35)
Glucose, Bld: 110 mg/dL — ABNORMAL HIGH (ref 70–99)
Potassium: 4.7 mEq/L (ref 3.5–5.1)

## 2012-04-07 LAB — CBC
HCT: 34.2 % — ABNORMAL LOW (ref 39.0–52.0)
Hemoglobin: 11.3 g/dL — ABNORMAL LOW (ref 13.0–17.0)
MCV: 90.5 fL (ref 78.0–100.0)
WBC: 9 10*3/uL (ref 4.0–10.5)

## 2012-04-07 NOTE — Progress Notes (Signed)
VASCULAR PROGRESS NOTE  SUBJECTIVE: Comfortable. Has been ambulating. No nausea.   PHYSICAL EXAM: Filed Vitals:   04/06/12 2020 04/06/12 2355 04/07/12 0400 04/07/12 0420  BP: 112/45 104/56  105/49  Pulse: 67 62  66  Temp:  98.4 F (36.9 C) 98.4 F (36.9 C)   TempSrc:  Oral Oral   Resp: 16 14  16   Height:      Weight:      SpO2: 94% 91%  92%   Both groins look fine. Palpable DP pulses  LABS: Lab Results  Component Value Date   WBC 9.0 04/07/2012   HGB 11.3* 04/07/2012   HCT 34.2* 04/07/2012   MCV 90.5 04/07/2012   PLT 211 04/07/2012   Lab Results  Component Value Date   CREATININE 1.53* 04/07/2012   Lab Results  Component Value Date   INR 1.30 04/06/2012   CBG (last 3)   Basename 04/06/12 0553  GLUCAP 113*     ASSESSMENT/PLAN: 1. 1 Day Post-Op s/p: EVAR 2. D/C today 3. I have arranged F/U in 2 weeks  Cari Caraway Beeper: [phone] 04/07/2012

## 2012-04-08 NOTE — Discharge Summary (Signed)
Agree with plans for D/C.  Chris Dickson Beeper [phone] 04/08/2012  

## 2012-04-09 ENCOUNTER — Encounter (HOSPITAL_COMMUNITY): Payer: Self-pay | Admitting: Vascular Surgery

## 2012-04-09 ENCOUNTER — Telehealth: Payer: Self-pay | Admitting: Vascular Surgery

## 2012-04-09 NOTE — Telephone Encounter (Addendum)
Message copied by Shari Prows on Mon Apr 09, 2012 11:18 AM ------      Message from: Melene Plan      Created: Fri Apr 06, 2012 10:49 AM      Regarding: FW: charge and f/u                   ----- Message -----         From: Chuck Hint, MD         Sent: 04/06/2012  10:10 AM           To: Reuel Derby, Melene Plan, RN      Subject: charge and f/u                                           PROCEDURE: Percutaneous Endovascular aneurysm repair using 3 components (Gore Excluder)            SURGEON: Di Kindle. Edilia Bo, MD, FACS            ASSIST: Della Goo,  PA            He needs a followup visit in 2 weeks with a CT scan of the abdomen and pelvis prior to that visit. Normally we do this at 4 weeks however he wants to leave for [facility].            csd  I scheduled an appt for the above patient for 04/18/12 at 12:30pm w/ [facility] and to have a ct scan prior @ [address] [facility] at 9:30am and to arrive at 9:15am. He is aware of these appts and he knows to pick up oral contrast prior to the ct scan. awt

## 2012-04-16 ENCOUNTER — Encounter (HOSPITAL_COMMUNITY): Payer: Self-pay

## 2012-04-17 ENCOUNTER — Encounter: Payer: Self-pay | Admitting: Vascular Surgery

## 2012-04-18 ENCOUNTER — Encounter: Payer: Self-pay | Admitting: Vascular Surgery

## 2012-04-18 ENCOUNTER — Ambulatory Visit
Admission: RE | Admit: 2012-04-18 | Discharge: 2012-04-18 | Disposition: A | Discharge status: Home / Self Care | Payer: Medicare Other | Source: Ambulatory Visit | Attending: Vascular Surgery | Admitting: Vascular Surgery

## 2012-04-18 ENCOUNTER — Ambulatory Visit (INDEPENDENT_AMBULATORY_CARE_PROVIDER_SITE_OTHER): Payer: Medicare Other | Admitting: Vascular Surgery

## 2012-04-18 VITALS — BP 103/50 | HR 59 | Resp 18 | Ht 67.0 in | Wt 225.0 lb

## 2012-04-18 DIAGNOSIS — Z48812 Encounter for surgical aftercare following surgery on the circulatory system: Secondary | ICD-10-CM

## 2012-04-18 DIAGNOSIS — I714 Abdominal aortic aneurysm, without rupture, unspecified: Secondary | ICD-10-CM

## 2012-04-18 MED ORDER — IOHEXOL 300 MG/ML  SOLN
100.0000 mL | Freq: Once | INTRAMUSCULAR | Status: AC | PRN
  Administered 2012-04-18: 100 mL via INTRAVENOUS

## 2012-04-18 MED ORDER — IOHEXOL 350 MG/ML SOLN: 80.0000 mL | Freq: Once | INTRAVENOUS | Status: DC | PRN

## 2012-04-18 NOTE — Progress Notes (Signed)
Vascular and Vein Specialist of [place]  Patient name: Jason CHAVARRIA MRN: [id number] DOB: [birth year] Sex: male  REASON FOR VISIT: follow up after percutaneous endovascular aneurysm repair.  HPI: Jason Henderson is a 71 y.o. male who I been following with an abdominal aortic aneurysm which enlarged to 5.7 cm. He underwent percutaneous endovascular aneurysm repair using 3 components on 04/06/2012. Comes in for a two-week follow up visit. He has no specific complaints overalls been doing well. He has gradually resuming his normal activities per   REVIEW OF SYSTEMS: Arly.Keller ] denotes positive finding; [  ] denotes negative finding  CARDIOVASCULAR:  [ ]  chest pain   [ ]  dyspnea on exertion    CONSTITUTIONAL:  [ ]  fever   [ ]  chills  PHYSICAL EXAM: Filed Vitals:   04/18/12 1221  BP: 103/50  Pulse: 59  Resp: 18  Height: 5\' 7"  (1.702 m)  Weight: 225 lb (102.059 kg)   Body mass index is 35.24 kg/(m^2). GENERAL: The patient is a well-nourished male, in no acute distress. The vital signs are documented above. CARDIOVASCULAR: There is a regular rate and rhythm  PULMONARY: There is good air exchange bilaterally without wheezing or rales. Abdomen is soft and nontender. I cannot palpate the aneurysm. He has palpable femoral pulses and warm well-perfused feet.  I reviewed his CT scan which shows the aneurysm is 5.6 cm in maximum diameter. The graft is in good position. There is no evidence of endoleak.  MEDICAL ISSUES:  Abdominal aneurysm without mention of rupture This patient is now 2 weeks status post percutaneous endovascular aneurysm repair. He has had no problems and CT scan shows the graft in good position without evidence of endoleak. I've arrange for a follow up CT scan in 6 months and I'll see him back at that time. He knows to call sooner if he has problems.   Nefertiti Mohamad S [facility] of [place] Beeper: [phone]

## 2012-04-18 NOTE — Assessment & Plan Note (Signed)
This patient is now 2 weeks status post percutaneous endovascular aneurysm repair. He has had no problems and CT scan shows the graft in good position without evidence of endoleak. I've arrange for a follow up CT scan in 6 months and I'll see him back at that time. He knows to call sooner if he has problems.

## 2012-04-19 NOTE — Addendum Note (Signed)
Addended by: Sharee Pimple on: 04/19/2012 11:53 AM   Modules accepted: Orders

## 2012-06-05 DIAGNOSIS — C61 Malignant neoplasm of prostate: Secondary | ICD-10-CM | POA: Diagnosis not present

## 2012-10-16 DIAGNOSIS — N3020 Other chronic cystitis without hematuria: Secondary | ICD-10-CM | POA: Diagnosis not present

## 2012-10-16 DIAGNOSIS — C61 Malignant neoplasm of prostate: Secondary | ICD-10-CM | POA: Diagnosis not present

## 2012-10-16 DIAGNOSIS — N1330 Unspecified hydronephrosis: Secondary | ICD-10-CM | POA: Diagnosis not present

## 2012-11-01 ENCOUNTER — Telehealth: Payer: Self-pay | Admitting: Oncology

## 2012-11-02 ENCOUNTER — Telehealth: Payer: Self-pay | Admitting: Oncology

## 2012-11-02 ENCOUNTER — Other Ambulatory Visit: Payer: Medicare Other | Admitting: Lab

## 2012-11-02 ENCOUNTER — Ambulatory Visit: Payer: Medicare Other | Admitting: Oncology

## 2012-11-05 ENCOUNTER — Telehealth: Payer: Self-pay | Admitting: Oncology

## 2012-11-06 ENCOUNTER — Encounter: Payer: Self-pay | Admitting: Vascular Surgery

## 2012-11-07 ENCOUNTER — Ambulatory Visit: Payer: Medicare Other | Admitting: Vascular Surgery

## 2012-11-07 ENCOUNTER — Inpatient Hospital Stay: Admission: RE | Admit: 2012-11-07 | Payer: Medicare Other | Source: Ambulatory Visit

## 2012-11-13 ENCOUNTER — Ambulatory Visit: Payer: Medicare Other | Admitting: Oncology

## 2012-11-13 ENCOUNTER — Other Ambulatory Visit: Payer: Medicare Other | Admitting: Lab

## 2012-11-28 ENCOUNTER — Telehealth: Payer: Self-pay | Admitting: Oncology

## 2012-11-28 NOTE — Telephone Encounter (Signed)
pt out of town and needed to r/s .Marland KitchenMarland KitchenMarland KitchenDone

## 2012-12-07 ENCOUNTER — Other Ambulatory Visit: Payer: Medicare Other | Admitting: Lab

## 2012-12-07 ENCOUNTER — Ambulatory Visit: Payer: Medicare Other | Admitting: Oncology

## 2012-12-10 ENCOUNTER — Other Ambulatory Visit: Payer: Self-pay | Admitting: Vascular Surgery

## 2012-12-10 DIAGNOSIS — I714 Abdominal aortic aneurysm, without rupture: Secondary | ICD-10-CM | POA: Diagnosis not present

## 2012-12-11 ENCOUNTER — Encounter: Payer: Self-pay | Admitting: Vascular Surgery

## 2012-12-12 ENCOUNTER — Encounter: Payer: Self-pay | Admitting: Vascular Surgery

## 2012-12-12 ENCOUNTER — Ambulatory Visit (INDEPENDENT_AMBULATORY_CARE_PROVIDER_SITE_OTHER): Payer: Medicare Other | Admitting: Vascular Surgery

## 2012-12-12 ENCOUNTER — Ambulatory Visit
Admission: RE | Admit: 2012-12-12 | Discharge: 2012-12-12 | Disposition: A | Discharge status: Home / Self Care | Payer: Medicare Other | Source: Ambulatory Visit | Attending: Vascular Surgery | Admitting: Vascular Surgery

## 2012-12-12 VITALS — BP 119/60 | HR 58 | Resp 18 | Ht 67.0 in | Wt 217.6 lb

## 2012-12-12 DIAGNOSIS — I714 Abdominal aortic aneurysm, without rupture: Secondary | ICD-10-CM

## 2012-12-12 DIAGNOSIS — Z48812 Encounter for surgical aftercare following surgery on the circulatory system: Secondary | ICD-10-CM

## 2012-12-12 MED ORDER — IOHEXOL 350 MG/ML SOLN
60.0000 mL | Freq: Once | INTRAVENOUS | Status: AC | PRN
  Administered 2012-12-12: 60 mL via INTRAVENOUS

## 2012-12-12 NOTE — Progress Notes (Signed)
Vascular and Vein Specialist of [place]  Patient name: Jason Henderson MRN: [id number] DOB: 09-01-1940 Sex: male  REASON FOR VISIT: follow up after EVAR  HPI: RASHAUD YBARBO is a 72 y.o. male who had a 5.7 cm infrarenal abdominal aortic aneurysm. He underwent percutaneous endovascular aneurysm repair using 3 components on 04/06/2012. Is 1 month postop CT scan showed the graft in good position with no evidence of endoleak. He comes in for a 6 month follow up visit. Since I saw him last, he denies any abdominal pain or back pain. There have been no significant changes in his medical history.  VASCULAR QUALITY INITIATIVE FOLLOW UP DATA:  Current smoker: [ X ] yes  [  ] no  Living status: [ X ]  Home  [  ] Nursing home  [  ] Homeless    MEDS:  ASA Arly.Keller  ] yes  [  ] no- [  ] medical reason  [  ] non compliant  STATIN  [ X ] yes  [  ] no- [  ] medical reason  [  ] non compliant  Beta blocker [  ] yes  [  ] no- [ X ] medical reason  [  ] non compliant  ACE inhibitor [  ] yes  [  ] no- Arly.Keller  ] medical reason  [  ] non compliant  P2Y12 Antagonist [ X ] none  [  ] clopidogrel-Plavix  [  ] ticlopidine-Ticlid   [  ] prasugrel-Effient  [  ] ticagrelor- Brilinta    Anticoagulant [ X ] None  [  ] warfarin  [  ] rivaroxaban-Xarelto [  ] dabigatran- Pradaxa  Current Max AAA = 5.6 mm  Endoleak:  Arly.Keller  ] no  [  ] yes- [  ] type I  [  ] type II  [  ] type III  [  ] indeterminate  Number of new interventions: 0 -   Date:      Why:  [  ] endoleak  [  ] limb occlusion   [  ] growth  [  ]  Migration   [  ] symtomatic/ rupture    Conversion to open repair: [  ] yes   Arly.Keller  ] no   Date:   Other operation related to EVAR:  [  ] yes   Arly.Keller  ] no   Past Medical History  Diagnosis Date  . Hypertension   . Abdominal aneurysm without mention of rupture   . Hyperlipidemia   . Prostate cancer   . Chronic kidney disease     renal insufficiency R side, stent placement   Family History  Problem Relation Age  of Onset  . Cancer Mother     Lung  . Cancer Father     Colon   SOCIAL HISTORY: History  Substance Use Topics  . Smoking status: Current Every Day Smoker -- 1.00 packs/day    Types: Cigarettes  . Smokeless tobacco: Never Used     Comment: He has the patches  . Alcohol Use: No   No Known Allergies  Current Outpatient Prescriptions  Medication Sig Dispense Refill  . aspirin 81 MG EC tablet Take 81 mg by mouth daily.        . bicalutamide (CASODEX) 50 MG tablet Take 50 mg by mouth daily.       . Calcium Carbonate-Vitamin D (CALTRATE 600+D PO) Take 1 tablet by mouth daily.      Marland Kitchen  Cholecalciferol (VITAMIN D3) 2000 UNITS TABS Take 2,000 Int'l Units by mouth. Alternates 1 tablet one day, then 2 tablets the next day      . doxazosin (CARDURA) 2 MG tablet Take 1 mg by mouth daily.       . fenofibrate 160 MG tablet Take 160 mg by mouth daily.        Marland Kitchen lisinopril (PRINIVIL,ZESTRIL) 20 MG tablet Take 10 mg by mouth daily.      Marland Kitchen oxybutynin (DITROPAN-XL) 10 MG 24 hr tablet Take 10 mg by mouth at bedtime.      . simvastatin (ZOCOR) 40 MG tablet Take 20 mg by mouth at bedtime.       Marland Kitchen oxyCODONE-acetaminophen (ROXICET) 5-325 MG per tablet Take 1 tablet by mouth every 4 (four) hours as needed for pain.  30 tablet  0   No current facility-administered medications for this visit.   REVIEW OF SYSTEMS: Arly.Keller ] denotes positive finding; [  ] denotes negative finding  CARDIOVASCULAR:  [ ]  chest pain   [ ]  chest pressure   [ ]  palpitations   [ ]  orthopnea   [ ]  dyspnea on exertion   [ ]  claudication   [ ]  rest pain   [ ]  DVT   [ ]  phlebitis PULMONARY:   [ ]  productive cough   [ ]  asthma   [ ]  wheezing NEUROLOGIC:   [ ]  weakness  [ ]  paresthesias  [ ]  aphasia  [ ]  amaurosis  [ ]  dizziness HEMATOLOGIC:   [ ]  bleeding problems   [ ]  clotting disorders MUSCULOSKELETAL:  [ ]  joint pain   [ ]  joint swelling [ ]  leg swelling GASTROINTESTINAL: [ ]   blood in stool  [ ]   hematemesis GENITOURINARY:  [ ]   dysuria   [ ]   hematuria PSYCHIATRIC:  [ ]  history of major depression INTEGUMENTARY:  [ ]  rashes  [ ]  ulcers CONSTITUTIONAL:  [ ]  fever   [ ]  chills  PHYSICAL EXAM: Filed Vitals:   12/12/12 1232  BP: 119/60  Pulse: 58  Resp: 18  Height: 5\' 7"  (1.702 m)  Weight: 217 lb 9.6 oz (98.703 kg)   Body mass index is 34.07 kg/(m^2). GENERAL: The patient is a well-nourished male, in no acute distress. The vital signs are documented above. CARDIOVASCULAR: There is a regular rate and rhythm. I do not detect carotid bruits. He has palpable femoral and pedal pulses bilaterally. He has no significant lower extremity swelling. PULMONARY: There is good air exchange bilaterally without wheezing or rales. ABDOMEN: Soft and non-tender with normal pitched bowel sounds.  MUSCULOSKELETAL: There are no major deformities or cyanosis. NEUROLOGIC: No focal weakness or paresthesias are detected. SKIN: There are no ulcers or rashes noted. PSYCHIATRIC: The patient has a normal affect.  DATA:  I have reviewed his CT scan of the abdomen and pelvis. He shows no evidence of endoleak. The graft is in good position. The maximum diameter of the aneurysm is 5.6 cm and the os has not changed from his postop study.  MEDICAL ISSUES: The patient is doing well status post EVAR. Given that he does not have an endoleak I think it would be safe to do a duplex scan in 6 months to follow his aneurysm. If the aneurysm remained stable in size that he can probably be followed at yearly intervals. Overall pleased with his progress. I seen in 6 months. He is to call sooner for has problems.  Antonius Hartlage S [facility]  [facility] of [facility] [employer]: [phone]

## 2012-12-13 ENCOUNTER — Other Ambulatory Visit (HOSPITAL_BASED_OUTPATIENT_CLINIC_OR_DEPARTMENT_OTHER): Payer: Medicare Other | Admitting: Lab

## 2012-12-13 ENCOUNTER — Ambulatory Visit (HOSPITAL_BASED_OUTPATIENT_CLINIC_OR_DEPARTMENT_OTHER): Payer: Medicare Other | Admitting: Oncology

## 2012-12-13 ENCOUNTER — Telehealth: Payer: Self-pay | Admitting: Oncology

## 2012-12-13 VITALS — BP 134/63 | HR 70 | Temp 98.0°F | Resp 18 | Ht 67.0 in | Wt 217.2 lb

## 2012-12-13 DIAGNOSIS — C61 Malignant neoplasm of prostate: Secondary | ICD-10-CM

## 2012-12-13 LAB — PSA: PSA: <0.01 ng/mL (ref <=4.00)

## 2012-12-13 LAB — CBC WITH DIFFERENTIAL/PLATELET
Basophils Absolute: 0.1 10*3/uL (ref 0.0–0.1)
Eosinophils Absolute: 0.4 10*3/uL (ref 0.0–0.5)
HCT: 41.3 % (ref 38.4–49.9)
HGB: 13.6 g/dL (ref 13.0–17.1)
LYMPH%: 27.3 % (ref 14.0–49.0)
MCV: 87.9 fL (ref 79.3–98.0)
MONO#: 0.5 10*3/uL (ref 0.1–0.9)
NEUT#: 4.4 10*3/uL (ref 1.5–6.5)
NEUT%: 58.8 % (ref 39.0–75.0)
Platelets: 265 10*3/uL (ref 140–400)
WBC: 7.5 10*3/uL (ref 4.0–10.3)

## 2012-12-13 LAB — COMPREHENSIVE METABOLIC PANEL (CC13)
BUN: 29.5 mg/dL — ABNORMAL HIGH (ref 7.0–26.0)
CO2: 23 mEq/L (ref 22–29)
Creatinine: 1.6 mg/dL — ABNORMAL HIGH (ref 0.7–1.3)
Glucose: 165 mg/dl — ABNORMAL HIGH (ref 70–140)
Sodium: 140 mEq/L (ref 136–145)
Total Bilirubin: 0.35 mg/dL (ref 0.20–1.20)
Total Protein: 7.4 g/dL (ref 6.4–8.3)

## 2012-12-13 NOTE — Progress Notes (Signed)
Hematology and Oncology Follow Up Visit  Jason Henderson [id number] 1941/04/13 72 y.o. 12/13/2012 8:36 AM  CC: [name], MD  [name], M.D.   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 He was diagnosed in October 2008. He presented with a Gleason score of 5+4=9 and PSA of 18. He presented with a bulky retroperitoneal and obstructive uropathy.  He is also had bilateral hydronephrosis, status post stent placement. That has currently been removed.  Current therapy: Androgen deprivation with Lupron and Casodex. His PSA continued to be 0.01 when checked in September 2012.  Interim History: [name] presents today for a followup visit. He has continued to very well without any major concerns at the time being. He has continued to perform activities of daily living without any hindrance or decline. He had not reported any abdominal pain. He had not reported any back pain. He had not had any deterioration in his performance status. He reports no GU complaints.  He is S/P abdominal aortic aneurysm repair in 03/2012 without complications.   Medications: I have reviewed the patient's current medications.  Current Outpatient Prescriptions  Medication Sig Dispense Refill  . aspirin 81 MG EC tablet Take 81 mg by mouth daily.        . bicalutamide (CASODEX) 50 MG tablet Take 50 mg by mouth daily.       . Calcium Carbonate-Vitamin D (CALTRATE 600+D PO) Take 1 tablet by mouth daily.      . Cholecalciferol (VITAMIN D3) 2000 UNITS TABS Take 2,000 Int'l Units by mouth. Alternates 1 tablet one day, then 2 tablets the next day      . doxazosin (CARDURA) 2 MG tablet Take 1 mg by mouth daily.       . fenofibrate 160 MG tablet Take 160 mg by mouth daily.        Marland Kitchen lisinopril (PRINIVIL,ZESTRIL) 20 MG tablet Take 10 mg by mouth daily.      Marland Kitchen oxybutynin (DITROPAN-XL) 10 MG 24 hr tablet Take 10 mg by mouth at bedtime.      . simvastatin (ZOCOR) 40 MG  tablet Take 20 mg by mouth at bedtime.        No current facility-administered medications for this visit.    Allergies: No Known Allergies  Past Medical History, Surgical history, Social history, and Family History were reviewed and updated.  Review of Systems: Constitutional:  Negative for fever, chills, night sweats, anorexia, weight loss, pain. Cardiovascular: no chest pain or dyspnea on exertion Respiratory: negative Neurological: negative Dermatological: negative ENT: negative Skin: Negative. Gastrointestinal: no abdominal pain, change in bowel habits, or black or bloody stools Genito-Urinary: negative Hematological and Lymphatic: negative Breast: negative Musculoskeletal: negative Remaining ROS negative. Physical Exam: Blood pressure 134/63, pulse 70, temperature 98 F (36.7 C), temperature source Oral, resp. rate 18, height 5\' 7"  (1.702 m), weight 217 lb 3.2 oz (98.521 kg). ECOG: 1 General appearance: alert Head: Normocephalic, without obvious abnormality, atraumatic Neck: no adenopathy, no carotid bruit, no JVD, supple, symmetrical, trachea midline and thyroid not enlarged, symmetric, no tenderness/mass/nodules Lymph nodes: Cervical, supraclavicular, and axillary nodes normal. Heart:regular rate and rhythm, S1, S2 normal, no murmur, click, rub or gallop Lung:chest clear, no wheezing, rales, normal symmetric air entry Abdomin: soft, non-tender, without masses or organomegaly EXT:no erythema, induration, or nodules   Lab Results: Lab Results  Component Value Date   WBC 7.5 12/13/2012   HGB 13.6 12/13/2012   HCT 41.3 12/13/2012  MCV 87.9 12/13/2012   PLT 265 12/13/2012     Chemistry      Component Value Date/Time   NA 140 04/07/2012 0356   NA 137 03/23/2012 1140   K 4.7 04/07/2012 0356   K 4.4 03/23/2012 1140   CL 108 04/07/2012 0356   CL 105 03/23/2012 1140   CO2 24 04/07/2012 0356   CO2 24 03/23/2012 1140   BUN 28* 12/10/2012 1353   BUN 27.0* 03/23/2012 1140    CREATININE 1.92* 12/10/2012 1353   CREATININE 1.53* 04/07/2012 0356   CREATININE 1.6* 03/23/2012 1140      Component Value Date/Time   CALCIUM 9.1 04/07/2012 0356   CALCIUM 10.7* 03/23/2012 1140   ALKPHOS 38* 04/02/2012 1317   ALKPHOS 43 03/23/2012 1140   AST 18 04/02/2012 1317   AST 19 03/23/2012 1140   ALT 13 04/02/2012 1317   ALT 16 03/23/2012 1140   BILITOT 0.4 04/02/2012 1317   BILITOT 0.59 03/23/2012 1140      Impression and Plan: This is a 72 year old gentleman with the following  issues:  1. Hormone sensitive advanced prostate cancer, bulky adenopathy, and obstructive uropathy. He continues to have excellent PSA response. CT scan from 12/12/2012 did not show any relapse.  For the time being, we will continue on the current regimen and follow him in 6 months' time. He is to continue to be on Lupron and Casodex. If his PSA starts to rise again, then we will treat him with Casodex withdrawal.  2. Obstructive uropathy. That is no longer an issue.    [place] Day, MD 7/31/20148:36 AM

## 2012-12-13 NOTE — Telephone Encounter (Signed)
gv and printed appt sched and avs for pt  °

## 2012-12-14 NOTE — Addendum Note (Signed)
Addended by: Sharee Pimple on: 12/14/2012 08:25 AM   Modules accepted: Orders

## 2013-02-19 DIAGNOSIS — N3020 Other chronic cystitis without hematuria: Secondary | ICD-10-CM | POA: Diagnosis not present

## 2013-02-19 DIAGNOSIS — I10 Essential (primary) hypertension: Secondary | ICD-10-CM | POA: Diagnosis not present

## 2013-02-19 DIAGNOSIS — E782 Mixed hyperlipidemia: Secondary | ICD-10-CM | POA: Diagnosis not present

## 2013-02-19 DIAGNOSIS — N1330 Unspecified hydronephrosis: Secondary | ICD-10-CM | POA: Diagnosis not present

## 2013-02-19 DIAGNOSIS — C61 Malignant neoplasm of prostate: Secondary | ICD-10-CM | POA: Diagnosis not present

## 2013-05-28 DIAGNOSIS — E119 Type 2 diabetes mellitus without complications: Secondary | ICD-10-CM | POA: Diagnosis not present

## 2013-05-28 DIAGNOSIS — E782 Mixed hyperlipidemia: Secondary | ICD-10-CM | POA: Diagnosis not present

## 2013-05-28 DIAGNOSIS — I10 Essential (primary) hypertension: Secondary | ICD-10-CM | POA: Diagnosis not present

## 2013-06-18 ENCOUNTER — Encounter: Payer: Self-pay | Admitting: Family

## 2013-06-19 ENCOUNTER — Ambulatory Visit (INDEPENDENT_AMBULATORY_CARE_PROVIDER_SITE_OTHER): Payer: Medicare Other | Admitting: Family

## 2013-06-19 ENCOUNTER — Encounter: Payer: Self-pay | Admitting: Family

## 2013-06-19 ENCOUNTER — Ambulatory Visit (HOSPITAL_COMMUNITY)
Admission: RE | Admit: 2013-06-19 | Discharge: 2013-06-19 | Disposition: A | Discharge status: Home / Self Care | Payer: Medicare Other | Source: Ambulatory Visit | Attending: Family | Admitting: Family

## 2013-06-19 VITALS — BP 89/55 | HR 57 | Resp 14 | Ht 67.0 in | Wt 212.0 lb

## 2013-06-19 DIAGNOSIS — I714 Abdominal aortic aneurysm, without rupture, unspecified: Secondary | ICD-10-CM

## 2013-06-19 DIAGNOSIS — Z09 Encounter for follow-up examination after completed treatment for conditions other than malignant neoplasm: Secondary | ICD-10-CM | POA: Insufficient documentation

## 2013-06-19 DIAGNOSIS — Z48812 Encounter for surgical aftercare following surgery on the circulatory system: Secondary | ICD-10-CM | POA: Insufficient documentation

## 2013-06-19 IMAGING — CT CT ABD-PELV W/ CM
3 of 5 series · 13 of 36 positions shown, 19 images · IV contrast (omnipaque)
Comparison: 02/08/2012

CLINICAL DATA: Endovascular stent graft of the aortic aneurysm 2
weeks ago, follow-up; history chronic kidney disease, prostate
cancer, hypertension

CT ABDOMEN AND PELVIS WITH CONTRAST
TECHNIQUE: Multidetector CT imaging of the abdomen and pelvis was
performed following the standard protocol during bolus
administration of intravenous contrast. Sagittal and coronal MPR
images reconstructed from axial data set.
Contrast: 100mL OMNIPAQUE IOHEXOL 300 MG/ML  SOLN. No oral contrast
administered.

[Series 3: abd/pelvis with · axial · 0.89mm/px · z∈[-375,-40]mm · 7 of 91 slices shown, 12 images]
[im 12/91  soft-tissue]
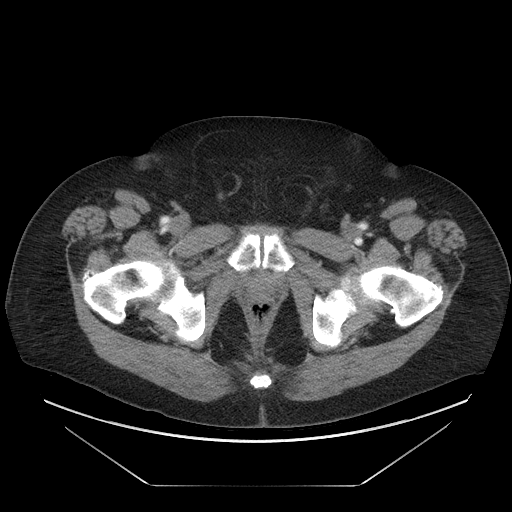
[im 12/91  bone]
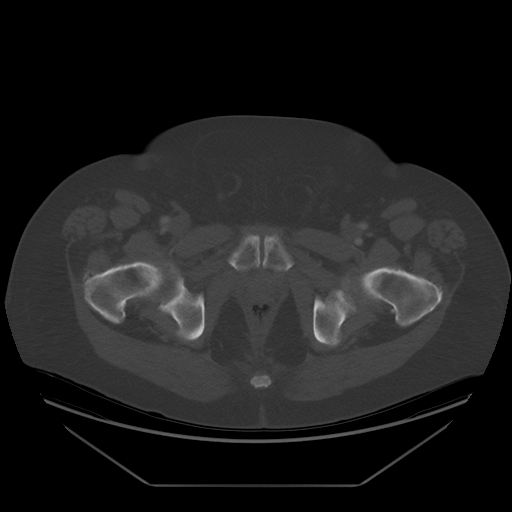
[im 23/91  soft-tissue]
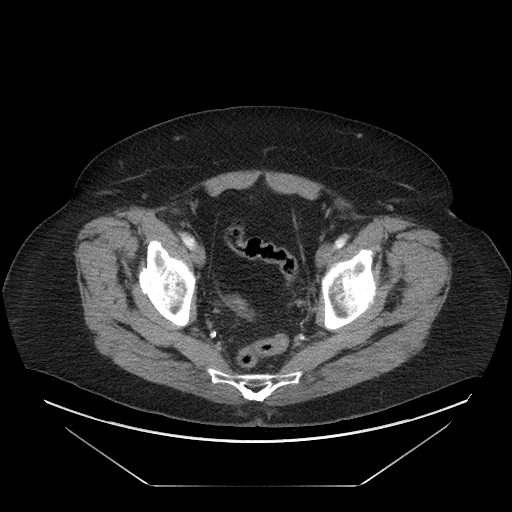
[im 34/91  soft-tissue]
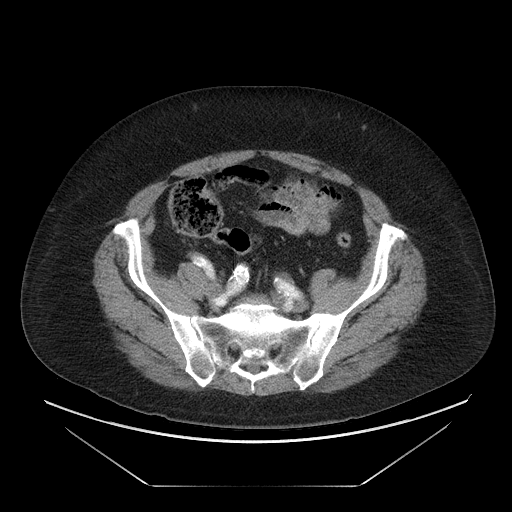
[im 46/91  soft-tissue]
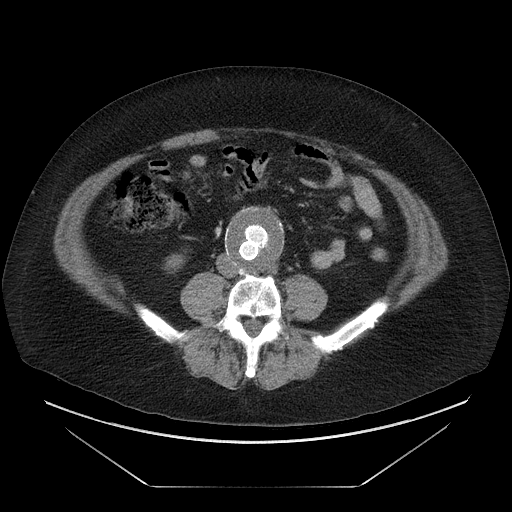
[im 46/91  lung]
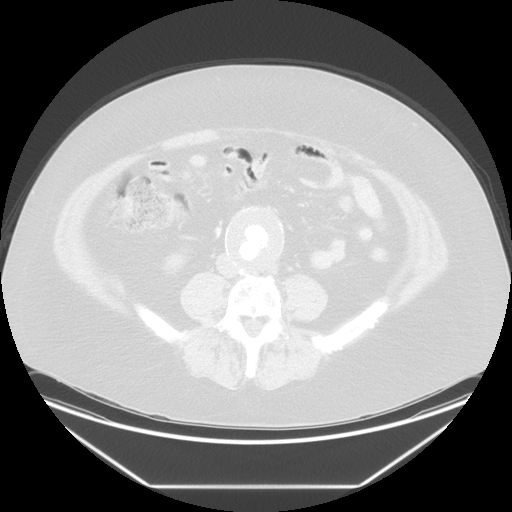
[im 57/91  soft-tissue]
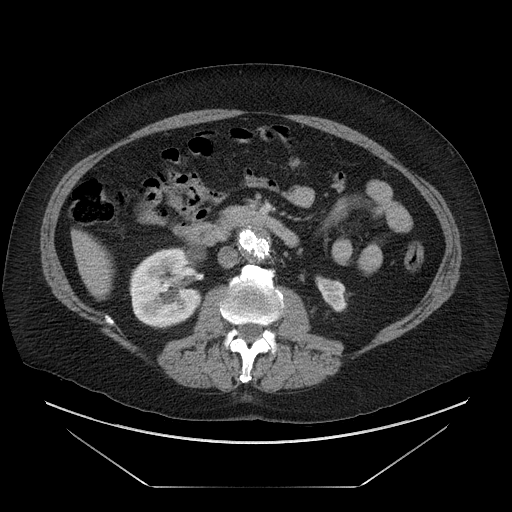
[im 57/91  lung]
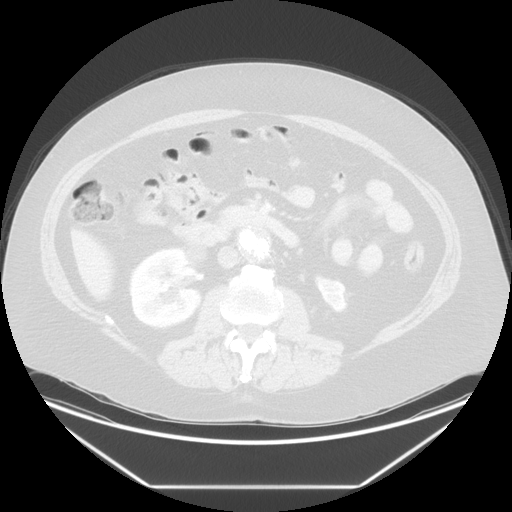
[im 68/91  soft-tissue]
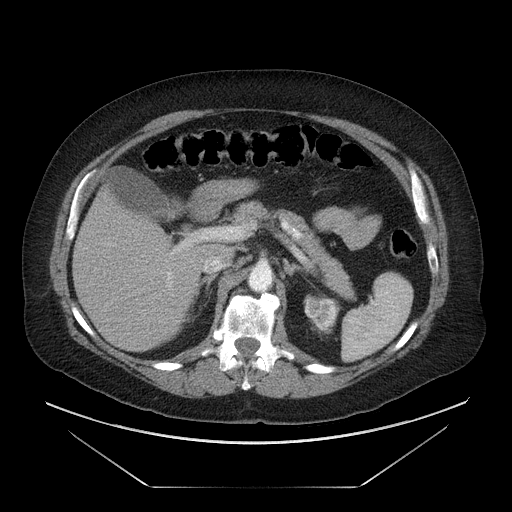
[im 68/91  lung]
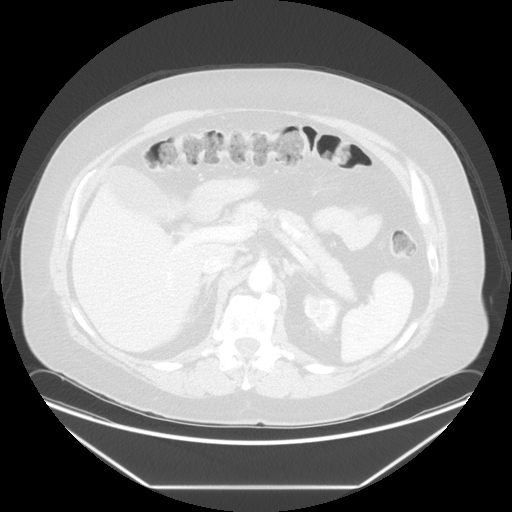
[im 79/91  soft-tissue]
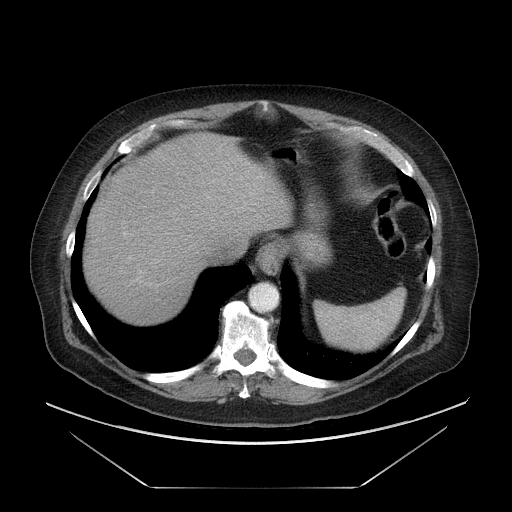
[im 79/91  lung]
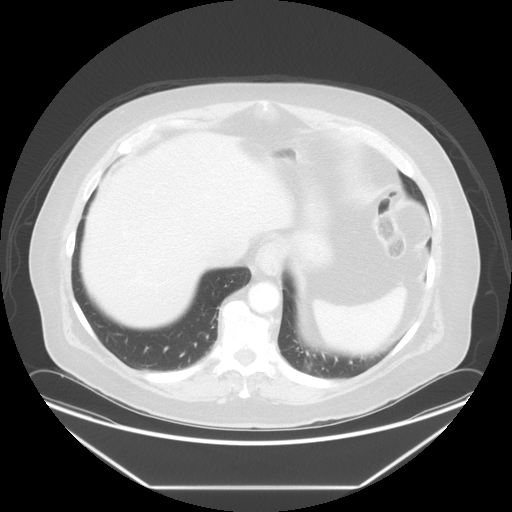

[Series 601: coronal body · coronal · 0.91mm/px · 1 of 133 slices shown, 2 images]
[im 45/133  soft-tissue]
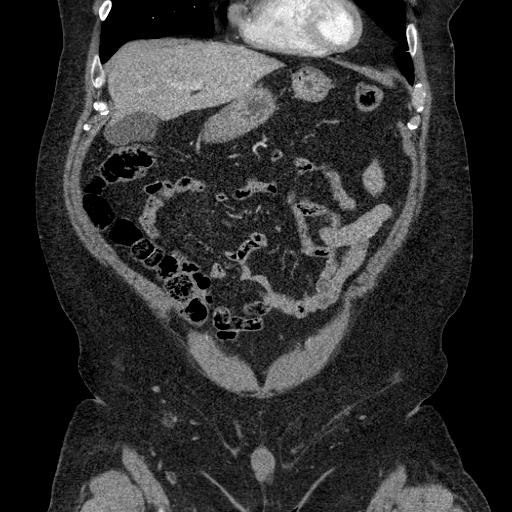
[im 45/133  bone]
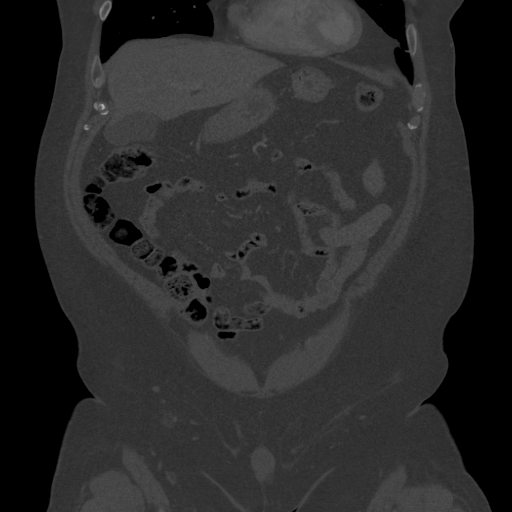

[Series 602: sagittal body · sagittal · 0.91mm/px · 5 of 183 slices shown]
[im 12/183  soft-tissue]
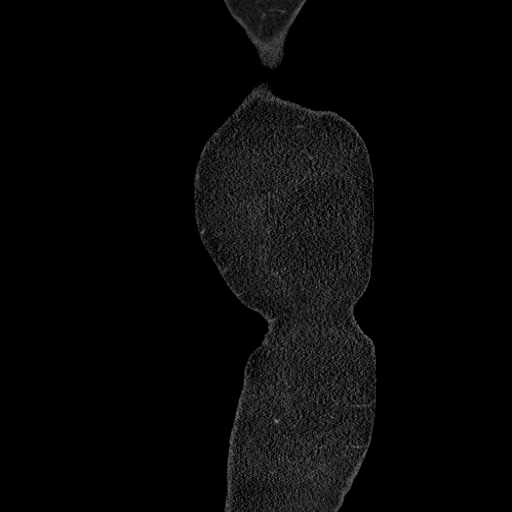
[im 35/183  soft-tissue]
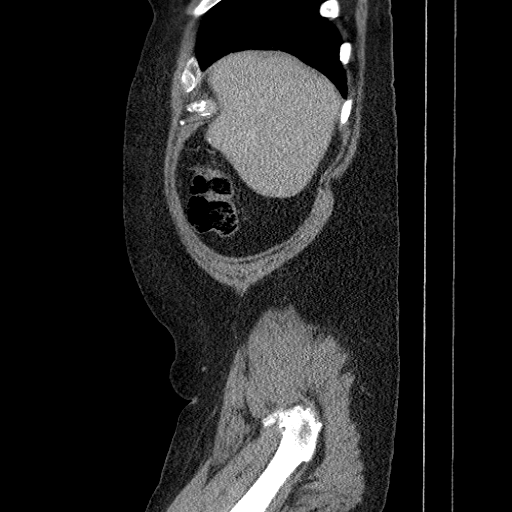
[im 57/183  soft-tissue]
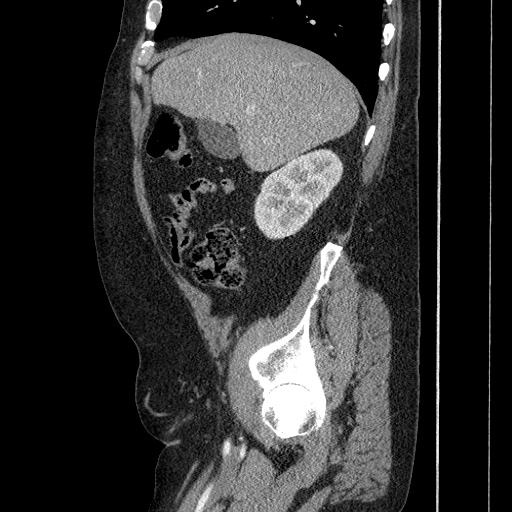
[im 80/183  soft-tissue]
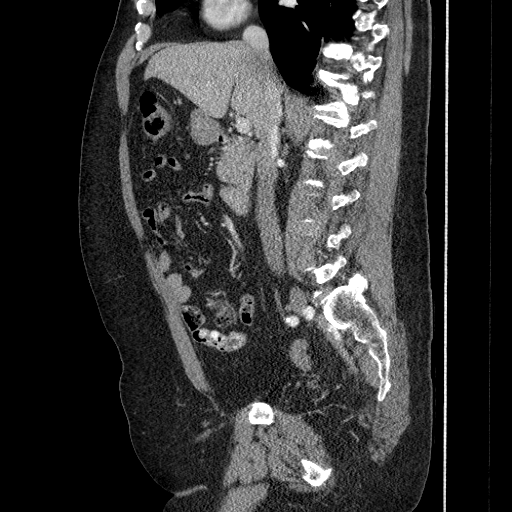
[im 103/183  soft-tissue]
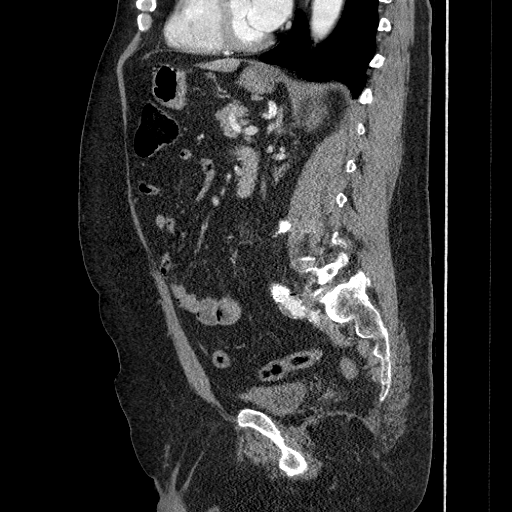

[13 of 36 positions shown; findings below may reference images not displayed]

FINDINGS: Minimal atelectasis at lung bases.
Atrophic scarred left kidney.
Cyst at anteromedial mid right kidney 2.4 x 2.2 cm unchanged.
Liver, spleen, pancreas, right kidney, and adrenal glands otherwise
normal appearance.
Post endoluminal stent graft repair of an abdominal aortic
aneurysm.
Aortic aneurysm measures 5.3 x 5.6 cm image 44 and extends 10 cm in
length.
No evidence of endoleak or periaortic hemorrhage/infiltration.
Scattered atherosclerotic calcifications of the iliac arteries.
Normal appendix.

Stomach and bowel loops unremarkable for exam lacking GI contrast.
Small amount of air within urinary bladder question related to
prior catheterization.
Bladder wall is slightly thickened though this could be an artifact
from underdistension.
No mass, adenopathy, free fluid, or inflammatory process.
Again identified sclerotic lesion within the T12 vertebral body
question sclerotic metastasis in a patient with a history prostate
cancer.
Bilateral spondylolysis L5 with minimal spondylolisthesis.
IMPRESSION: Post endovascular stenting of an abdominal aortic aneurysm without
evidence of endoleak.
Stable size of abdominal aortic aneurysm versus previous study.
Otherwise no change.

## 2013-06-19 NOTE — Patient Instructions (Addendum)
Abdominal Aortic Aneurysm An aneurysm is a weakened or damaged part of an artery wall that bulges from the normal force of blood pumping through the body. An abdominal aortic aneurysm is an aneurysm that occurs in the lower part of the aorta, the main artery of the body.  The major concern with an abdominal aortic aneurysm is that it can enlarge and burst (rupture) or blood can flow between the layers of the wall of the aorta through a tear (aorticdissection). Both of these conditions can cause bleeding inside the body and can be life threatening unless diagnosed and treated promptly. CAUSES  The exact cause of an abdominal aortic aneurysm is unknown. Some contributing factors are:   A hardening of the arteries caused by the buildup of fat and other substances in the lining of a blood vessel (arteriosclerosis).  Inflammation of the walls of an artery (arteritis).   Connective tissue diseases, such as Marfan syndrome.   Abdominal trauma.   An infection, such as syphilis or staphylococcus, in the wall of the aorta (infectious aortitis) caused by bacteria. RISK FACTORS  Risk factors that contribute to an abdominal aortic aneurysm may include:  Age older than 60 years.   High blood pressure (hypertension).  Male gender.  Ethnicity (white race).  Obesity.  Family history of aneurysm (first degree relatives only).  Tobacco use. PREVENTION  The following healthy lifestyle habits may help decrease your risk of abdominal aortic aneurysm:  Quitting smoking. Smoking can raise your blood pressure and cause arteriosclerosis.  Limiting or avoiding alcohol.  Keeping your blood pressure, blood sugar level, and cholesterol levels within normal limits.  Decreasing your salt intake. In somepeople, too much salt can raise blood pressure and increase your risk of abdominal aortic aneurysm.  Eating a diet low in saturated fats and cholesterol.  Increasing your fiber intake by including  whole grains, vegetables, and fruits in your diet. Eating these foods may help lower blood pressure.  Maintaining a healthy weight.  Staying physically active and exercising regularly. SYMPTOMS  The symptoms of abdominal aortic aneurysm may vary depending on the size and rate of growth of the aneurysm.Most grow slowly and do not have any symptoms. When symptoms do occur, they may include:  Pain (abdomen, side, lower back, or groin). The pain may vary in intensity. A sudden onset of severe pain may indicate that the aneurysm has ruptured.  Feeling full after eating only small amounts of food.  Nausea or vomiting or both.  Feeling a pulsating lump in the abdomen.  Feeling faint or passing out. DIAGNOSIS  Since most unruptured abdominal aortic aneurysms have no symptoms, they are often discovered during diagnostic exams for other conditions. An aneurysm may be found during the following procedures:  Ultrasonography (A one-time screening for abdominal aortic aneurysm by ultrasonography is also recommended for all men aged 65-75 years who have ever smoked).  X-ray exams.  A computed tomography (CT).  Magnetic resonance imaging (MRI).  Angiography or arteriography. TREATMENT  Treatment of an abdominal aortic aneurysm depends on the size of your aneurysm, your age, and risk factors for rupture. Medication to control blood pressure and pain may be used to manage aneurysms smaller than 6 cm. Regular monitoring for enlargement may be recommended by your caregiver if:  The aneurysm is 3 4 cm in size (an annual ultrasonography may be recommended).  The aneurysm is 4 4.5 cm in size (an ultrasonography every 6 months may be recommended).  The aneurysm is larger than 4.5   cm in size (your caregiver may ask that you be examined by a vascular surgeon). If your aneurysm is larger than 6 cm, surgical repair may be recommended. There are two main methods for repair of an aneurysm:   Endovascular  repair (a minimally invasive surgery). This is done most often.  Open repair. This method is used if an endovascular repair is not possible. Document Released: 02/09/2005 Document Revised: 08/27/2012 Document Reviewed: 06/01/2012 [employer] Patient Information 2014 [employer].   Stroke Prevention Some medical conditions and behaviors are associated with an increased chance of having a stroke. You may prevent a stroke by making healthy choices and managing medical conditions. HOW CAN I REDUCE MY RISK OF HAVING A STROKE?   Stay physically active. Get at least 30 minutes of activity on most or all days.  Do not smoke. It may also be helpful to avoid exposure to secondhand smoke.  Limit alcohol use. Moderate alcohol use is considered to be:  No more than 2 drinks per day for men.  No more than 1 drink per day for nonpregnant women.  Eat healthy foods. This involves  Eating 5 or more servings of fruits and vegetables a day.  Following a diet that addresses high blood pressure (hypertension), high cholesterol, diabetes, or obesity.  Manage your cholesterol levels.  A diet low in saturated fat, trans fat, and cholesterol and high in fiber may control cholesterol levels.  Take any prescribed medicines to control cholesterol as directed by your health care provider.  Manage your diabetes.  A controlled-carbohydrate, controlled-sugar diet is recommended to manage diabetes.  Take any prescribed medicines to control diabetes as directed by your health care provider.  Control your hypertension.  A low-salt (sodium), low-saturated fat, low-trans fat, and low-cholesterol diet is recommended to manage hypertension.  Take any prescribed medicines to control hypertension as directed by your health care provider.  Maintain a healthy weight.  A reduced-calorie, low-sodium, low-saturated fat, low-trans fat, low-cholesterol diet is recommended to manage weight.  Stop drug  abuse.  Avoid taking birth control pills.  Talk to your health care provider about the risks of taking birth control pills if you are over 35 years old, smoke, get migraines, or have ever had a blood clot.  Get evaluated for sleep disorders (sleep apnea).  Talk to your health care provider about getting a sleep evaluation if you snore a lot or have excessive sleepiness.  Take medicines as directed by your health care provider.  For some people, aspirin or blood thinners (anticoagulants) are helpful in reducing the risk of forming abnormal blood clots that can lead to stroke. If you have the irregular heart rhythm of atrial fibrillation, you should be on a blood thinner unless there is a good reason you cannot take them.  Understand all your medicine instructions.  Make sure that other other conditions (such as anemia or atherosclerosis) are addressed. SEEK IMMEDIATE MEDICAL CARE IF:   You have sudden weakness or numbness of the face, arm, or leg, especially on one side of the body.  Your face or eyelid droops to one side.  You have sudden confusion.  You have trouble speaking (aphasia) or understanding.  You have sudden trouble seeing in one or both eyes.  You have sudden trouble walking.  You have dizziness.  You have a loss of balance or coordination.  You have a sudden, severe headache with no known cause.  You have new chest pain or an irregular heartbeat. Any of these symptoms   may represent a serious problem that is an emergency. Do not wait to see if the symptoms will go away. Get medical help at once. Call your local emergency services  (911 in [place] Do not drive yourself to the hospital. Document Released: 06/09/2004 Document Revised: 02/20/2013 Document Reviewed: 11/02/2012 Exit[employer] Patient Information 2014 [employer].   Smoking Cessation Quitting smoking is important to your health and has many advantages. However, it is not always easy to quit since  nicotine is a very addictive drug. Often times, people try 3 times or more before being able to quit. This document explains the best ways for you to prepare to quit smoking. Quitting takes hard work and a lot of effort, but you can do it. ADVANTAGES OF QUITTING SMOKING  You will live longer, feel better, and live better.  Your body will feel the impact of quitting smoking almost immediately.  Within 20 minutes, blood pressure decreases. Your pulse returns to its normal level.  After 8 hours, carbon monoxide levels in the blood return to normal. Your oxygen level increases.  After 24 hours, the chance of having a heart attack starts to decrease. Your breath, hair, and body stop smelling like smoke.  After 48 hours, damaged nerve endings begin to recover. Your sense of taste and smell improve.  After 72 hours, the body is virtually free of nicotine. Your bronchial tubes relax and breathing becomes easier.  After 2 to 12 weeks, lungs can hold more air. Exercise becomes easier and circulation improves.  The risk of having a heart attack, stroke, cancer, or lung disease is greatly reduced.  After 1 year, the risk of coronary heart disease is cut in half.  After 5 years, the risk of stroke falls to the same as a nonsmoker.  After 10 years, the risk of lung cancer is cut in half and the risk of other cancers decreases significantly.  After 15 years, the risk of coronary heart disease drops, usually to the level of a nonsmoker.  If you are pregnant, quitting smoking will improve your chances of having a healthy baby.  The people you live with, especially any children, will be healthier.  You will have extra money to spend on things other than cigarettes. QUESTIONS TO THINK ABOUT BEFORE ATTEMPTING TO QUIT You may want to talk about your answers with your caregiver.  Why do you want to quit?  If you tried to quit in the past, what helped and what did not?  What will be the most  difficult situations for you after you quit? How will you plan to handle them?  Who can help you through the tough times? Your family? Friends? A caregiver?  What pleasures do you get from smoking? What ways can you still get pleasure if you quit? Here are some questions to ask your caregiver:  How can you help me to be successful at quitting?  What medicine do you think would be best for me and how should I take it?  What should I do if I need more help?  What is smoking withdrawal like? How can I get information on withdrawal? GET READY  Set a quit date.  Change your environment by getting rid of all cigarettes, ashtrays, matches, and lighters in your home, car, or work. Do not let people smoke in your home.  Review your past attempts to quit. Think about what worked and what did not. GET SUPPORT AND ENCOURAGEMENT You have a better chance of being successful   have help. You can get support in many ways.  Tell your family, friends, and co-workers that you are going to quit and need their support. Ask them not to smoke around you.  Get individual, group, or telephone counseling and support. Programs are available at [employer] and health centers. Call your local health department for information about programs in your area.  Spiritual beliefs and practices may help some smokers quit.  Download a "quit meter" on your computer to keep track of quit statistics, such as how long you have gone without smoking, cigarettes not smoked, and money saved.  Get a self-help book about quitting smoking and staying off of tobacco. Sarah Ann yourself from urges to smoke. Talk to someone, go for a walk, or occupy your time with a task.  Change your normal routine. Take a different route to work. Drink tea instead of coffee. Eat breakfast in a different place.  Reduce your stress. Take a hot bath, exercise, or read a book.  Plan something enjoyable to do every day. Reward  yourself for not smoking.  Explore interactive web-based programs that specialize in helping you quit. GET MEDICINE AND USE IT CORRECTLY Medicines can help you stop smoking and decrease the urge to smoke. Combining medicine with the above behavioral methods and support can greatly increase your chances of successfully quitting smoking.  Nicotine replacement therapy helps deliver nicotine to your body without the negative effects and risks of smoking. Nicotine replacement therapy includes nicotine gum, lozenges, inhalers, nasal sprays, and skin patches. Some may be available over-the-counter and others require a prescription.  Antidepressant medicine helps people abstain from smoking, but how this works is unknown. This medicine is available by prescription.  Nicotinic receptor partial agonist medicine simulates the effect of nicotine in your brain. This medicine is available by prescription. Ask your caregiver for advice about which medicines to use and how to use them based on your health history. Your caregiver will tell you what side effects to look out for if you choose to be on a medicine or therapy. Carefully read the information on the package. Do not use any other product containing nicotine while using a nicotine replacement product.  RELAPSE OR DIFFICULT SITUATIONS Most relapses occur within the first 3 months after quitting. Do not be discouraged if you start smoking again. Remember, most people try several times before finally quitting. You may have symptoms of withdrawal because your body is used to nicotine. You may crave cigarettes, be irritable, feel very hungry, cough often, get headaches, or have difficulty concentrating. The withdrawal symptoms are only temporary. They are strongest when you first quit, but they will go away within 10 14 days. To reduce the chances of relapse, try to:  Avoid drinking alcohol. Drinking lowers your chances of successfully quitting.  Reduce the  amount of caffeine you consume. Once you quit smoking, the amount of caffeine in your body increases and can give you symptoms, such as a rapid heartbeat, sweating, and anxiety.  Avoid smokers because they can make you want to smoke.  Do not let weight gain distract you. Many smokers will gain weight when they quit, usually less than 10 pounds. Eat a healthy diet and stay active. You can always lose the weight gained after you quit.  Find ways to improve your mood other than smoking. FOR MORE INFORMATION  www.smokefree.gov  Document Released: 04/26/2001 Document Revised: 11/01/2011 Document Reviewed: 08/11/2011 [facility] Patient Information 2014 [place], [place].

## 2013-06-19 NOTE — Progress Notes (Signed)
[facility] [place]  Established Abdominal Aortic Aneurysm  History of Present Illness  Jason Henderson is a 73 y.o. (06/02/1940) male patient of Dr. 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 He underwent percutaneous endovascular aneurysm repair using 3 components on 04/06/2012. He presents with chief complaint: follow up for AAA. The patient does denies back or abdominal pain.  The patient is a smoker. Walking 4-5 days/week. The patient denies claudication in legs with walking. The patient denies history of stroke or TIA symptoms, denies dizziness, is hypotensive today.  Pt Diabetic: No Pt smoker: smoker  (1 ppd x 12 yrs)  Past Medical History  Diagnosis Date  . Hypertension   . Abdominal aneurysm without mention of rupture   . Hyperlipidemia   . Prostate cancer   . Chronic kidney disease     renal insufficiency R side, stent placement   Past Surgical History  Procedure Laterality Date  . Transurethral resection of prostate    . Tonsillectomy and adenoidectomy    . Abdominal aortic endovascular stent graft  04/06/2012    Procedure: ABDOMINAL AORTIC ENDOVASCULAR STENT GRAFT;  Surgeon: Angelia Mould, MD;  Location: [facility];  Service: Vascular;  Laterality: N/A;  GORE; ultrasound guided.   Social History History   Social History  . Marital Status: Widowed    Spouse Name: N/A    Number of Children: 1  . Years of Education: N/A   Occupational History  . Not on file.   Social History Main Topics  . Smoking status: Current Every Day Smoker -- 1.00 packs/day    Types: Cigarettes  . Smokeless tobacco: Never Used     Comment: He has the patches  . Alcohol Use: No  . Drug Use: No  . Sexual Activity: Not on file   Other Topics Concern  . Not on file   Social History Narrative   Lives part time in [place].  Works half the year.    Family History Family History  Problem Relation Age of Onset  . Cancer Mother     Lung   . Cancer Father     Colon    Current Outpatient Prescriptions on File Prior to Visit  Medication Sig Dispense Refill  . aspirin 81 MG EC tablet Take 81 mg by mouth daily.        . bicalutamide (CASODEX) 50 MG tablet Take 50 mg by mouth daily.       . Calcium Carbonate-Vitamin D (CALTRATE 600+D PO) Take 1 tablet by mouth daily.      . Cholecalciferol (VITAMIN D3) 2000 UNITS TABS Take 2,000 Int'l Units by mouth. Alternates 1 tablet one day, then 2 tablets the next day      . doxazosin (CARDURA) 2 MG tablet Take 1 mg by mouth daily.       Marland Kitchen lisinopril (PRINIVIL,ZESTRIL) 20 MG tablet Take 20 mg by mouth daily.       Marland Kitchen oxybutynin (DITROPAN-XL) 10 MG 24 hr tablet Take 10 mg by mouth at bedtime.      . fenofibrate 160 MG tablet Take 160 mg by mouth daily.        . simvastatin (ZOCOR) 40 MG tablet Take 20 mg by mouth at bedtime.        No current facility-administered medications on file prior to visit.   No Known Allergies  ROS: See HPI for pertinent positives and negatives.  Physical Examination  Filed Vitals:   06/19/13 2800  BP: 89/55  Pulse: 57  Resp: 14  Height: 5\' 7"  (1.702 m)  Weight: 212 lb (96.163 kg)  SpO2: 95%   Body mass index is 33.2 kg/(m^2).  General: A&O x 3, WD, Obese.  Pulmonary: Sym exp, good air movt, CTAB, no rales, rhonchi, or wheezing.   Cardiac: RRR, Nl S1, S2, no Murmurs, rubs or gallops.  Carotid Bruits Left Right   Negative Positive   Aorta is is not palpable. Radial pulses are 2+ and =.                          VASCULAR EXAM:                                                                                                         LE Pulses LEFT RIGHT       FEMORAL   palpable   palpable        POPLITEAL  not palpable   not palpable       POSTERIOR TIBIAL  not palpable    palpable        DORSALIS PEDIS      ANTERIOR TIBIAL  palpable   palpable      Gastrointestinal: soft, NTND, -G/R, - HSM, - masses, - CVAT B.  Musculoskeletal: M/S  5/5 throughout, Extremities without ischemic changes.   Neurologic: CN 2-12 intact, Pain and light touch intact in extremities, Motor exam as listed above.  Non-Invasive Vascular Imaging  AAA Duplex (06/19/2013)  ABDOMINAL AORTA DUPLEX EVALUATION - POST ENDOVASCULAR REPAIR    INDICATION: Evaluation of endovascular abdominal repair of aortic aneurysm.    PREVIOUS INTERVENTION(S): Percutaneous Endovascular aneurysm repair for a 5.7 infrarenal aneurysm on  04/06/2012     DUPLEX EXAM:      DIAMETER AP (cm) DIAMETER TRANSVERSE (cm) VELOCITIES (cm/sec)  Aorta 4.46 4.46 51  Right Common Iliac 1.38 1.46 56  Left Common Iliac 1.63 1.53 41    Comparison Study       Date DIAMETER AP (cm) DIAMETER TRANSVERSE (cm)  08/03/2011 5.35 5.39     ADDITIONAL FINDINGS:     IMPRESSION: Patent endovascular abdominal aortic repair with a maximum diameter of 4.46 x 4.46cm.    Compared to the previous exam:  Decrease in maximum diameter, status post intervention.    Medical Decision Making  The patient is a 73 y.o. male who presents with asymptomatic AAA with decrease in size. A right sided neck bruit is auscultated, he has no TIA or stroke symptoms recently or in the past, I offered a carotid Duplex w/i the next couple of weeks, he will call back to schedule since he lives in [place] and travels for work. He was counseled re smoking cessation. He takes a daily ASA and statin, he gets regular exercise, he is losing weight with effort, he rarely uses ETOH. His atherosclerotic risk factors are smoking and obesity and he is working on quitting smoking, uses nicotine patches. He is working on losing weight.   Based on this patient's exam  and diagnostic studies, the patient will follow up in 1 year  with the following studies: AAA Duplex, carotid Duplex as soon as possible, pt will call to schedule.  The threshold for repair is AAA size > 5.5 cm, growth > 1 cm/yr, and symptomatic status.  I emphasized  the importance of maximal medical management including strict control of blood pressure, blood glucose, and lipid levels, antiplatelet agents, obtaining regular exercise, and cessation of smoking.   The patient was given information about AAA including signs, symptoms, treatment, and how to minimize the risk of enlargement and rupture of aneurysms.    The patient was advised to call 911 should the patient experience sudden onset abdominal or back pain.   Thank you for allowing [place] to participate in this patient's care.  Clemon Chambers, RN, MSN, FNP-C [facility] Office: [phone]  Clinic Physician: Scot Dock  06/19/2013, 9:12 AM

## 2013-06-19 NOTE — Addendum Note (Signed)
Addended by: Dorthula Rue L on: 06/19/2013 04:33 PM   Modules accepted: Orders

## 2013-06-20 DIAGNOSIS — Z23 Encounter for immunization: Secondary | ICD-10-CM | POA: Diagnosis not present

## 2013-06-20 DIAGNOSIS — C61 Malignant neoplasm of prostate: Secondary | ICD-10-CM | POA: Diagnosis not present

## 2013-06-20 DIAGNOSIS — N1330 Unspecified hydronephrosis: Secondary | ICD-10-CM | POA: Diagnosis not present

## 2013-06-25 ENCOUNTER — Other Ambulatory Visit: Payer: Medicare Other

## 2013-06-25 ENCOUNTER — Ambulatory Visit: Payer: Medicare Other | Admitting: Oncology

## 2013-07-12 ENCOUNTER — Telehealth: Payer: Self-pay | Admitting: Oncology

## 2013-07-12 NOTE — Telephone Encounter (Signed)
pt called to r/s missed appt...done....pt aware of new d.t °

## 2013-07-16 ENCOUNTER — Ambulatory Visit (HOSPITAL_COMMUNITY)
Admission: RE | Admit: 2013-07-16 | Discharge: 2013-07-16 | Disposition: A | Discharge status: Home / Self Care | Payer: Medicare Other | Source: Ambulatory Visit | Attending: Family | Admitting: Family

## 2013-07-16 DIAGNOSIS — I6529 Occlusion and stenosis of unspecified carotid artery: Secondary | ICD-10-CM | POA: Diagnosis not present

## 2013-07-16 DIAGNOSIS — I714 Abdominal aortic aneurysm, without rupture, unspecified: Secondary | ICD-10-CM | POA: Diagnosis not present

## 2013-07-16 DIAGNOSIS — Z48812 Encounter for surgical aftercare following surgery on the circulatory system: Secondary | ICD-10-CM | POA: Diagnosis not present

## 2013-08-19 ENCOUNTER — Other Ambulatory Visit: Payer: Self-pay

## 2013-08-19 DIAGNOSIS — I6529 Occlusion and stenosis of unspecified carotid artery: Secondary | ICD-10-CM

## 2013-08-19 DIAGNOSIS — I714 Abdominal aortic aneurysm, without rupture, unspecified: Secondary | ICD-10-CM

## 2013-08-19 NOTE — Patient Instructions (Signed)
Dear [name],  You have moderate to severe blockage in the Right Internal  Carotid Artery.  If you have mini strokes or symptoms-   CALL  911   AAA  S/P  EVAR- there was a decrease in Sac size- no leak- GOOD results.       Abdominal Aortic Aneurysm  Blood pumps away from the heart through tubes (blood vessels) called arteries. Aneurysms are weak or damaged places in the wall of an artery. It bulges out like a balloon. An abdominal aortic aneurysm happens in the main artery of the body (aorta). It can burst or tear, causing bleeding inside the body. This is an emergency. It needs treatment right away. CAUSES  The exact cause is unknown. Things that could cause this problem include:  Fat and other substances building up in the lining of a tube.  Swelling of the walls of a blood vessel.  Certain tissue diseases.  Belly (abdominal) trauma.  An infection in the main artery of the body. RISK FACTORS There are things that make it more likely for you to have an aneurysm. These include:  Being over the age of 72 years old.  Having high blood pressure (hypertension).  Being a male.  Being white.  Being very overweight (obese).  Having a family history of aneurysm.  Using tobacco products. PREVENTION To lessen your chance of getting this condition:  Stop smoking. Stop chewing tobacco.  Limit or avoid alcohol.  Keep your blood pressure, blood sugar, and cholesterol within normal limits.  Eat less salt.  Eat foods low in saturated fats and cholesterol. These are found in animal and whole dairy products.  Eat more fiber. Fiber is found in whole grains, vegetables, and fruits.  Keep a healthy weight.  Stay active and exercise often. SYMPTOMS Symptoms depend on the size of the aneurysm and how fast it grows. There may not be symptoms. If symptoms occur, they can include:  Pain (belly, side, lower back, or groin).  Feeling full after eating a small amount of  food.  Feeling sick to your stomach (nauseous), throwing up (vomiting), or both.  Feeling a lump in your belly that feels like it is beating (pulsating).  Feeling like you will pass out (faint). TREATMENT   Medicine to control blood pressure and pain.  Imaging tests to see if the aneurysm gets bigger.  Surgery. MAKE SURE YOU:   Understand these instructions.  Will watch your condition.  Will get help right away if you are not doing well or get worse. Document Released: 08/27/2012 Document Reviewed: 08/27/2012 [facility] Patient Information 2014 [place], [place]. Stroke Prevention Some medical conditions and behaviors are associated with an increased chance of having a stroke. You may prevent a stroke by making healthy choices and managing medical conditions. HOW CAN I REDUCE MY RISK OF HAVING A STROKE?   Stay physically active. Get at least 30 minutes of activity on most or all days.  Do not smoke. It may also be helpful to avoid exposure to secondhand smoke.  Limit alcohol use. Moderate alcohol use is considered to be:  No more than 2 drinks per day for men.  No more than 1 drink per day for nonpregnant women.  Eat healthy foods. This involves  Eating 5 or more servings of fruits and vegetables a day.  Following a diet that addresses high blood pressure (hypertension), high cholesterol, diabetes, or obesity.  Manage your cholesterol levels.  A diet low in saturated fat, trans fat, and  cholesterol and high in fiber may control cholesterol levels.  Take any prescribed medicines to control cholesterol as directed by your health care provider.  Manage your diabetes.  A controlled-carbohydrate, controlled-sugar diet is recommended to manage diabetes.  Take any prescribed medicines to control diabetes as directed by your health care provider.  Control your hypertension.  A low-salt (sodium), low-saturated fat, low-trans fat, and low-cholesterol diet is recommended to  manage hypertension.  Take any prescribed medicines to control hypertension as directed by your health care provider.  Maintain a healthy weight.  A reduced-calorie, low-sodium, low-saturated fat, low-trans fat, low-cholesterol diet is recommended to manage weight.  Stop drug abuse.  Avoid taking birth control pills.  Talk to your health care provider about the risks of taking birth control pills if you are over 8 years old, smoke, get migraines, or have ever had a blood clot.  Get evaluated for sleep disorders (sleep apnea).  Talk to your health care provider about getting a sleep evaluation if you snore a lot or have excessive sleepiness.  Take medicines as directed by your health care provider.  For some people, aspirin or blood thinners (anticoagulants) are helpful in reducing the risk of forming abnormal blood clots that can lead to stroke. If you have the irregular heart rhythm of atrial fibrillation, you should be on a blood thinner unless there is a good reason you cannot take them.  Understand all your medicine instructions.  Make sure that other other conditions (such as anemia or atherosclerosis) are addressed. SEEK IMMEDIATE MEDICAL CARE IF:   You have sudden weakness or numbness of the face, arm, or leg, especially on one side of the body.  Your face or eyelid droops to one side.  You have sudden confusion.  You have trouble speaking (aphasia) or understanding.  You have sudden trouble seeing in one or both eyes.  You have sudden trouble walking.  You have dizziness.  You have a loss of balance or coordination.  You have a sudden, severe headache with no known cause.  You have new chest pain or an irregular heartbeat. Any of these symptoms may represent a serious problem that is an emergency. Do not wait to see if the symptoms will go away. Get medical help at once. Call your local emergency services  (911 in [place] Do not drive yourself to the  hospital. Document Released: 06/09/2004 Document Revised: 02/20/2013 Document Reviewed: 11/02/2012 [facility] Patient Information 2014 [place].

## 2013-08-21 ENCOUNTER — Encounter: Payer: Self-pay | Admitting: Vascular Surgery

## 2013-11-05 ENCOUNTER — Ambulatory Visit (HOSPITAL_BASED_OUTPATIENT_CLINIC_OR_DEPARTMENT_OTHER): Payer: Medicare Other | Admitting: Oncology

## 2013-11-05 ENCOUNTER — Other Ambulatory Visit (HOSPITAL_BASED_OUTPATIENT_CLINIC_OR_DEPARTMENT_OTHER): Payer: Medicare Other

## 2013-11-05 ENCOUNTER — Telehealth: Payer: Self-pay | Admitting: Oncology

## 2013-11-05 VITALS — BP 129/65 | HR 65 | Temp 97.9°F | Resp 18 | Ht 67.0 in | Wt 208.1 lb

## 2013-11-05 DIAGNOSIS — C61 Malignant neoplasm of prostate: Secondary | ICD-10-CM | POA: Diagnosis not present

## 2013-11-05 DIAGNOSIS — N1330 Unspecified hydronephrosis: Secondary | ICD-10-CM | POA: Diagnosis not present

## 2013-11-05 LAB — CBC WITH DIFFERENTIAL/PLATELET
BASO%: 1.4 % (ref 0.0–2.0)
BASOS ABS: 0.1 10*3/uL (ref 0.0–0.1)
EOS%: 6.2 % (ref 0.0–7.0)
Eosinophils Absolute: 0.6 10*3/uL — ABNORMAL HIGH (ref 0.0–0.5)
HCT: 43.2 % (ref 38.4–49.9)
HEMOGLOBIN: 14.1 g/dL (ref 13.0–17.1)
LYMPH%: 24.7 % (ref 14.0–49.0)
MCH: 29.7 pg (ref 27.2–33.4)
MCHC: 32.7 g/dL (ref 32.0–36.0)
MCV: 90.8 fL (ref 79.3–98.0)
MONO#: 0.6 10*3/uL (ref 0.1–0.9)
MONO%: 6.1 % (ref 0.0–14.0)
NEUT#: 5.5 10*3/uL (ref 1.5–6.5)
NEUT%: 61.6 % (ref 39.0–75.0)
PLATELETS: 251 10*3/uL (ref 140–400)
RBC: 4.76 10*6/uL (ref 4.20–5.82)
RDW: 14.2 % (ref 11.0–14.6)
WBC: 9 10*3/uL (ref 4.0–10.3)
lymph#: 2.2 10*3/uL (ref 0.9–3.3)

## 2013-11-05 LAB — COMPREHENSIVE METABOLIC PANEL (CC13)
ALT: 18 U/L (ref 0–55)
ANION GAP: 8 meq/L (ref 3–11)
AST: 22 U/L (ref 5–34)
Albumin: 4 g/dL (ref 3.5–5.0)
Alkaline Phosphatase: 72 U/L (ref 40–150)
BILIRUBIN TOTAL: 1.02 mg/dL (ref 0.20–1.20)
BUN: 20 mg/dL (ref 7.0–26.0)
CALCIUM: 10.3 mg/dL (ref 8.4–10.4)
CO2: 26 mEq/L (ref 22–29)
CREATININE: 1.3 mg/dL (ref 0.7–1.3)
Chloride: 106 mEq/L (ref 98–109)
GLUCOSE: 95 mg/dL (ref 70–140)
POTASSIUM: 4.5 meq/L (ref 3.5–5.1)
Sodium: 140 mEq/L (ref 136–145)
TOTAL PROTEIN: 7.2 g/dL (ref 6.4–8.3)

## 2013-11-05 NOTE — Telephone Encounter (Signed)
gv and printed appt sched and avs for pt for Jan 2016 °

## 2013-11-05 NOTE — Progress Notes (Signed)
Hematology and Oncology Follow Up Visit  Jason Henderson [id number] May 18, 1940 73 y.o. 11/05/2013 10:49 AM  CC: Jason Amass, MD  Rachell Cipro, M.D.   Principle Diagnosis: This is a 73 year old gentleman with hormone sensitive prostate cancer. He was diagnosed in October 2008. He presented with a Gleason score of 5+4=9 and PSA of 18. He presented with a bulky retroperitoneal and obstructive uropathy.  He is also had bilateral hydronephrosis, status post stent placement. That has currently been removed.  Current therapy: Androgen deprivation with Lupron and Casodex. His PSA continued to be 0.01.   Interim History: Jason Henderson presents today for a followup visit by himself. He has continued to very well without any major concerns at the time being. 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 He had not reported any abdominal pain. He had not reported any back pain. He had not had any deterioration in his performance status. He reports no GU complaints.  He does not report any headaches or blurry vision or double vision. Does not report any skeletal complaints. As I for any chest pain shortness of breath cough or hemoptysis. Does not report any nausea or vomiting. Rest or view of system is unremarkable.  Medications: I have reviewed the patient's current medications.  Current Outpatient Prescriptions  Medication Sig Dispense Refill  . aspirin 81 MG EC tablet Take 81 mg by mouth daily.        Marland Kitchen atorvastatin (LIPITOR) 80 MG tablet Take 80 mg by mouth daily.      . bicalutamide (CASODEX) 50 MG tablet Take 50 mg by mouth daily.       . Calcium Carbonate-Vitamin D (CALTRATE 600+D PO) Take 1 tablet by mouth daily.      . Cholecalciferol (VITAMIN D3) 2000 UNITS TABS Take 2,000 Int'l Units by mouth. Alternates 1 tablet one day, then 2 tablets the next day      . doxazosin (CARDURA)  2 MG tablet Take 1 mg by mouth daily.       . fenofibrate 160 MG tablet Take 160 mg by mouth daily.        Marland Kitchen lisinopril (PRINIVIL,ZESTRIL) 20 MG tablet Take 20 mg by mouth daily.       Marland Kitchen oxybutynin (DITROPAN-XL) 10 MG 24 hr tablet Take 10 mg by mouth at bedtime.      . simvastatin (ZOCOR) 40 MG tablet Take 20 mg by mouth at bedtime.        No current facility-administered medications for this visit.    Allergies: No Known Allergies  Past Medical History, Surgical history, Social history, and Family History were reviewed and updated.   Physical Exam: Blood pressure 129/65, pulse 65, temperature 97.9 F (36.6 C), temperature source Oral, resp. rate 18, height 5\' 7"  (1.702 m), weight 208 lb 1.6 oz (94.394 kg). ECOG: 1 General appearance: alert awake not in any distress. Head: Normocephalic, without obvious abnormality, atraumatic Neck: no adenopathy Lymph nodes: Cervical, supraclavicular, and axillary nodes normal. Heart:regular rate and rhythm, S1, S2 normal, no murmur, click, rub or gallop Lung:chest clear, no wheezing, rales, normal symmetric air entry Abdomin: soft, non-tender, without masses or organomegaly EXT:no erythema, induration, or nodules   Lab Results: Lab Results  Component Value Date   WBC 9.0 11/05/2013   HGB 14.1 11/05/2013   HCT 43.2 11/05/2013   MCV 90.8 11/05/2013   PLT 251 11/05/2013  Chemistry      Component Value Date/Time   NA 140 12/13/2012 0755   NA 140 04/07/2012 0356   K 3.8 12/13/2012 0755   K 4.7 04/07/2012 0356   CL 108 04/07/2012 0356   CL 105 03/23/2012 1140   CO2 23 12/13/2012 0755   CO2 24 04/07/2012 0356   BUN 29.5* 12/13/2012 0755   BUN 28* 12/10/2012 1353   CREATININE 1.6* 12/13/2012 0755   CREATININE 1.92* 12/10/2012 1353   CREATININE 1.53* 04/07/2012 0356      Component Value Date/Time   CALCIUM 10.0 12/13/2012 0755   CALCIUM 9.1 04/07/2012 0356   ALKPHOS 46 12/13/2012 0755   ALKPHOS 38* 04/02/2012 1317   AST 18 12/13/2012 0755    AST 18 04/02/2012 1317   ALT 13 12/13/2012 0755   ALT 13 04/02/2012 1317   BILITOT 0.35 12/13/2012 0755   BILITOT 0.4 04/02/2012 1317      Impression and Plan: This is a 73 year old gentleman with the following  issues:  1. Hormone sensitive advanced prostate cancer, bulky adenopathy, and obstructive uropathy. He continues to have excellent PSA response. CT scan from 12/12/2012 did not show any relapse. His PSA is currently pending from today but if his PSA starts to rise, he will need restaging and likely use a different hormonal agents.  2. Followup: Will be around January of 2016.   [facility], MD 6/23/201510:49 AM

## 2013-11-06 LAB — PSA: PSA: <0.01 ng/mL (ref <=4.00)

## 2013-12-04 ENCOUNTER — Ambulatory Visit: Payer: Medicare Other | Admitting: Family

## 2013-12-04 ENCOUNTER — Other Ambulatory Visit (HOSPITAL_COMMUNITY): Payer: Medicare Other

## 2013-12-11 ENCOUNTER — Encounter: Payer: Self-pay | Admitting: Family

## 2013-12-12 ENCOUNTER — Ambulatory Visit (HOSPITAL_COMMUNITY)
Admission: RE | Admit: 2013-12-12 | Discharge: 2013-12-12 | Disposition: A | Discharge status: Home / Self Care | Payer: Medicare Other | Source: Ambulatory Visit | Attending: Family | Admitting: Family

## 2013-12-12 ENCOUNTER — Ambulatory Visit (INDEPENDENT_AMBULATORY_CARE_PROVIDER_SITE_OTHER): Payer: Medicare Other | Admitting: Family

## 2013-12-12 ENCOUNTER — Encounter: Payer: Self-pay | Admitting: Family

## 2013-12-12 VITALS — BP 98/67 | HR 55 | Resp 16 | Ht 67.0 in | Wt 209.0 lb

## 2013-12-12 DIAGNOSIS — Z48812 Encounter for surgical aftercare following surgery on the circulatory system: Secondary | ICD-10-CM | POA: Diagnosis not present

## 2013-12-12 DIAGNOSIS — I714 Abdominal aortic aneurysm, without rupture, unspecified: Secondary | ICD-10-CM | POA: Diagnosis not present

## 2013-12-12 DIAGNOSIS — I6529 Occlusion and stenosis of unspecified carotid artery: Secondary | ICD-10-CM | POA: Insufficient documentation

## 2013-12-12 DIAGNOSIS — I6521 Occlusion and stenosis of right carotid artery: Secondary | ICD-10-CM

## 2013-12-12 NOTE — Addendum Note (Signed)
Addended by: Mena Goes on: 12/12/2013 03:49 PM   Modules accepted: Orders

## 2013-12-12 NOTE — Progress Notes (Signed)
[facility] [place] HISTORY AND PHYSICAL   MRN : [id number]  History of Present Illness:   Jason Henderson is a 73 y.o. male patient of Dr. 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 He underwent percutaneous endovascular aneurysm repair using 3 components on 04/06/2012.  He also has carotid artery stenosis. He returns today for carotid artery surveillance, is due for AAA surveillance in February, 2016. The patient does denies back or abdominal pain. The patient is a smoker.  Walking 4-5 days/week.  The patient denies claudication in legs with walking, denies non healing wounds.  The patient denies history of stroke or TIA symptoms, denies dizziness, is hypotensive today.   Pt Diabetic: No  Pt smoker: smoker (1 ppd x 12 yrs)  Pt meds include: Statin :Yes ASA: Yes Other anticoagulants/antiplatelets: no   Current Outpatient Prescriptions  Medication Sig Dispense Refill  . aspirin 81 MG EC tablet Take 81 mg by mouth daily.        Marland Kitchen atorvastatin (LIPITOR) 80 MG tablet Take 80 mg by mouth daily.      . bicalutamide (CASODEX) 50 MG tablet Take 50 mg by mouth daily.       . Calcium Carbonate-Vitamin D (CALTRATE 600+D PO) Take 1 tablet by mouth daily.      . Cholecalciferol (VITAMIN D3) 2000 UNITS TABS Take 2,000 Int'l Units by mouth. Alternates 1 tablet one day, then 2 tablets the next day      . doxazosin (CARDURA) 2 MG tablet Take 1 mg by mouth daily.       Marland Kitchen lisinopril (PRINIVIL,ZESTRIL) 20 MG tablet Take 20 mg by mouth daily.       Marland Kitchen oxybutynin (DITROPAN-XL) 10 MG 24 hr tablet Take 10 mg by mouth at bedtime.       No current facility-administered medications for this visit.    Past Medical History  Diagnosis Date  . Hypertension   . Abdominal aneurysm without mention of rupture   . Hyperlipidemia   . Prostate cancer   . Chronic kidney disease     renal insufficiency R side, stent placement    Social History History  Substance  Use Topics  . Smoking status: Current Every Day Smoker -- 1.00 packs/day    Types: Cigarettes  . Smokeless tobacco: Never Used     Comment: He has the patches  . Alcohol Use: No    Family History Family History  Problem Relation Age of Onset  . Cancer Mother     Lung  . Cancer Father     Colon    Surgical History Past Surgical History  Procedure Laterality Date  . Transurethral resection of prostate    . Tonsillectomy and adenoidectomy    . Abdominal aortic endovascular stent graft  04/06/2012    Procedure: ABDOMINAL AORTIC ENDOVASCULAR STENT GRAFT;  Surgeon: Angelia Mould, MD;  Location: [facility];  Service: Vascular;  Laterality: N/A;  GORE; ultrasound guided.    No Known Allergies  Current Outpatient Prescriptions  Medication Sig Dispense Refill  . aspirin 81 MG EC tablet Take 81 mg by mouth daily.        Marland Kitchen atorvastatin (LIPITOR) 80 MG tablet Take 80 mg by mouth daily.      . bicalutamide (CASODEX) 50 MG tablet Take 50 mg by mouth daily.       . Calcium Carbonate-Vitamin D (CALTRATE 600+D PO) Take 1 tablet by mouth daily.      . Cholecalciferol (  VITAMIN D3) 2000 UNITS TABS Take 2,000 Int'l Units by mouth. Alternates 1 tablet one day, then 2 tablets the next day      . doxazosin (CARDURA) 2 MG tablet Take 1 mg by mouth daily.       Marland Kitchen lisinopril (PRINIVIL,ZESTRIL) 20 MG tablet Take 20 mg by mouth daily.       Marland Kitchen oxybutynin (DITROPAN-XL) 10 MG 24 hr tablet Take 10 mg by mouth at bedtime.       No current facility-administered medications for this visit.     REVIEW OF SYSTEMS: See HPI for pertinent positives and negatives.  Physical Examination There were no vitals filed for this visit. There is no weight on file to calculate BMI.  General: A&O x 3, WD, Obese.  Pulmonary: Sym exp, good air movt, CTAB, no rales, rhonchi, or wheezing.  Cardiac: RRR, Nl S1, S2, no Murmurdetected, rubs or gallops.  Carotid Bruits  Left  Right    Positive Positive   Aorta is is not  palpable.  Radial pulses are 2+ and =.  VASCULAR EXAM:  LE Pulses  LEFT  RIGHT   FEMORAL  palpable  Not palpable   POPLITEAL  not palpable  not palpable   POSTERIOR TIBIAL  not palpable  palpable   DORSALIS PEDIS  ANTERIOR TIBIAL  palpable  palpable   Gastrointestinal: soft, NTND, -G/R, - HSM, - masses, - CVAT B.  Musculoskeletal: M/S 5/5 throughout, Extremities without ischemic changes.  Neurologic: CN 2-12 intact, Pain and light touch intact in extremities, Motor exam as listed above.   Significant Diagnostic Studies:   Non-Invasive Vascular Imaging (12/12/2013): CEREBROVASCULAR DUPLEX EVALUATION    INDICATION: Carotid artery disease     PREVIOUS INTERVENTION(S):     DUPLEX EXAM:     RIGHT  LEFT  Peak Systolic Velocities (cm/s) End Diastolic Velocities (cm/s) Plaque LOCATION Peak Systolic Velocities (cm/s) End Diastolic Velocities (cm/s) Plaque  89 22  CCA PROXIMAL 114 26   96 21  CCA MID 90 28   75 19  CCA DISTAL 92 27   97 12 HT ECA 70 0 HT  278 101 HT ICA PROXIMAL 103 28 HT  172 28  ICA MID 84 31   186 50  ICA DISTAL 102 37     2.89 ICA / CCA Ratio (PSV) 1.14  Antegrade  Vertebral Flow Antegrade   253 Brachial Systolic Pressure (mmHg) 664  Triphasic  Brachial Artery Waveforms Triphasic     Plaque Morphology:  HM = Homogeneous, HT = Heterogeneous, CP = Calcific Plaque, SP = Smooth Plaque, IP = Irregular Plaque     ADDITIONAL FINDINGS:     IMPRESSION: Right internal carotid artery velocities suggest a 60-79% stenosis (high end of range).  Left internal carotid artery velocities suggest a <40% stenosis.     Compared to the previous exam:  No significant change in comparison to the last exam on 07/16/2013.    ASSESSMENT:  Jason Henderson is a 73 y.o. male  who had a 5.7 cm infrarenal abdominal aortic aneurysm. He underwent percutaneous endovascular aneurysm repair using 3 components on 04/06/2012. He is due for EVAR surveillance in February, 2016, he has no  abdominal or back pain. He also has carotid artery stenosis with no history of stroke or TIA. On carotid Duplex today right internal carotid artery velocities suggest a 60-79% stenosis (high end of range) and left internal carotid artery velocities suggest a <40% stenosis. No significant change in comparison to the  last exam on 07/16/2013.   PLAN:   Based on today's exam and non-invasive vascular lab results, the patient will follow up in in February, 2016 with EVAR Duplex as already scheduled, and will add carotid Duplex for 4 months. He was again counseled re smoking cessation. I discussed in depth with the patient the nature of atherosclerosis, and emphasized the importance of maximal medical management including strict control of blood pressure, blood glucose, and lipid levels, obtaining regular exercise, and cessation of smoking.  The patient is aware that without maximal medical management the underlying atherosclerotic disease process will progress, limiting the benefit of any interventions. The patient was given information about stroke prevention and what symptoms should prompt the patient to seek immediate medical care. The patient was given information about AAA including signs, symptoms, treatment,  what symptoms should prompt the patient to seek immediate medical care, and how to minimize the risk of enlargement and rupture of aneurysms.  Thank you for allowing [place] to participate in this patient's care.  Clemon Chambers, RN, MSN, FNP-C [facility] Office: [phone]  Clinic MD: [facility] 12/12/2013 2:49 PM

## 2013-12-12 NOTE — Patient Instructions (Signed)
Stroke Prevention Some medical conditions and behaviors are associated with an increased chance of having a stroke. You may prevent a stroke by making healthy choices and managing medical conditions. HOW CAN I REDUCE MY RISK OF HAVING A STROKE?   Stay physically active. Get at least 30 minutes of activity on most or all days.  Do not smoke. It may also be helpful to avoid exposure to secondhand smoke.  Limit alcohol use. Moderate alcohol use is considered to be:  No more than 2 drinks per day for men.  No more than 1 drink per day for nonpregnant women.  Eat healthy foods. This involves:  Eating 5 or more servings of fruits and vegetables a day.  Making dietary changes that address high blood pressure (hypertension), high cholesterol, diabetes, or obesity.  Manage your cholesterol levels.  Making food choices that are high in fiber and low in saturated fat, trans fat, and cholesterol may control cholesterol levels.  Take any prescribed medicines to control cholesterol as directed by your health care provider.  Manage your diabetes.  Controlling your carbohydrate and sugar intake is recommended to manage diabetes.  Take any prescribed medicines to control diabetes as directed by your health care provider.  Control your hypertension.  Making food choices that are low in salt (sodium), saturated fat, trans fat, and cholesterol is recommended to manage hypertension.  Take any prescribed medicines to control hypertension as directed by your health care provider.  Maintain a healthy weight.  Reducing calorie intake and making food choices that are low in sodium, saturated fat, trans fat, and cholesterol are recommended to manage weight.  Stop drug abuse.  Avoid taking birth control pills.  Talk to your health care provider about the risks of taking birth control pills if you are over 35 years old, smoke, get migraines, or have ever had a blood clot.  Get evaluated for sleep  disorders (sleep apnea).  Talk to your health care provider about getting a sleep evaluation if you snore a lot or have excessive sleepiness.  Take medicines only as directed by your health care provider.  For some people, aspirin or blood thinners (anticoagulants) are helpful in reducing the risk of forming abnormal blood clots that can lead to stroke. If you have the irregular heart rhythm of atrial fibrillation, you should be on a blood thinner unless there is a good reason you cannot take them.  Understand all your medicine instructions.  Make sure that other conditions (such as anemia or atherosclerosis) are addressed. SEEK IMMEDIATE MEDICAL CARE IF:   You have sudden weakness or numbness of the face, arm, or leg, especially on one side of the body.  Your face or eyelid droops to one side.  You have sudden confusion.  You have trouble speaking (aphasia) or understanding.  You have sudden trouble seeing in one or both eyes.  You have sudden trouble walking.  You have dizziness.  You have a loss of balance or coordination.  You have a sudden, severe headache with no known cause.  You have new chest pain or an irregular heartbeat. Any of these symptoms may represent a serious problem that is an emergency. Do not wait to see if the symptoms will go away. Get medical help at once. Call your local emergency services (911 in [place] Do not drive yourself to the hospital. Document Released: 06/09/2004 Document Revised: 09/16/2013 Document Reviewed: 11/02/2012 [employer] Patient Information 2015 [employer]. This information is not intended to replace advice given   to you by your health care provider. Make sure you discuss any questions you have with your health care provider.   Abdominal Aortic Aneurysm An aneurysm is a weakened or damaged part of an artery wall that bulges from the normal force of blood pumping through the body. An abdominal aortic aneurysm is an aneurysm that  occurs in the lower part of the aorta, the main artery of the body.  The major concern with an abdominal aortic aneurysm is that it can enlarge and burst (rupture) or blood can flow between the layers of the wall of the aorta through a tear (aorticdissection). Both of these conditions can cause bleeding inside the body and can be life threatening unless diagnosed and treated promptly. CAUSES  The exact cause of an abdominal aortic aneurysm is unknown. Some contributing factors are:   A hardening of the arteries caused by the buildup of fat and other substances in the lining of a blood vessel (arteriosclerosis).  Inflammation of the walls of an artery (arteritis).   Connective tissue diseases, such as Marfan syndrome.   Abdominal trauma.   An infection, such as syphilis or staphylococcus, in the wall of the aorta (infectious aortitis) caused by bacteria. RISK FACTORS  Risk factors that contribute to an abdominal aortic aneurysm may include:  Age older than 58 years.   High blood pressure (hypertension).  Male gender.  Ethnicity (white race).  Obesity.  Family history of aneurysm (first degree relatives only).  Tobacco use. PREVENTION  The following healthy lifestyle habits may help decrease your risk of abdominal aortic aneurysm:  Quitting smoking. Smoking can raise your blood pressure and cause arteriosclerosis.  Limiting or avoiding alcohol.  Keeping your blood pressure, blood sugar level, and cholesterol levels within normal limits.  Decreasing your salt intake. In somepeople, too much salt can raise blood pressure and increase your risk of abdominal aortic aneurysm.  Eating a diet low in saturated fats and cholesterol.  Increasing your fiber intake by including whole grains, vegetables, and fruits in your diet. Eating these foods may help lower blood pressure.  Maintaining a healthy weight.  Staying physically active and exercising regularly. SYMPTOMS  The  symptoms of abdominal aortic aneurysm may vary depending on the size and rate of growth of the aneurysm.Most grow slowly and do not have any symptoms. When symptoms do occur, they may include:  Pain (abdomen, side, lower back, or groin). The pain may vary in intensity. A sudden onset of severe pain may indicate that the aneurysm has ruptured.  Feeling full after eating only small amounts of food.  Nausea or vomiting or both.  Feeling a pulsating lump in the abdomen.  Feeling faint or passing out. DIAGNOSIS  Since most unruptured abdominal aortic aneurysms have no symptoms, they are often discovered during diagnostic exams for other conditions. An aneurysm may be found during the following procedures:  Ultrasonography (A one-time screening for abdominal aortic aneurysm by ultrasonography is also recommended for all men aged 5-75 years who have ever smoked).  X-ray exams.  A computed tomography (CT).  Magnetic resonance imaging (MRI).  Angiography or arteriography. TREATMENT  Treatment of an abdominal aortic aneurysm depends on the size of your aneurysm, your age, and risk factors for rupture. Medication to control blood pressure and pain may be used to manage aneurysms smaller than 6 cm. Regular monitoring for enlargement may be recommended by your caregiver if:  The aneurysm is 3-4 cm in size (an annual ultrasonography may be recommended).  The  aneurysm is 4-4.5 cm in size (an ultrasonography every 6 months may be recommended).  The aneurysm is larger than 4.5 cm in size (your caregiver may ask that you be examined by a vascular surgeon). If your aneurysm is larger than 6 cm, surgical repair may be recommended. There are two main methods for repair of an aneurysm:   Endovascular repair (a minimally invasive surgery). This is done most often.  Open repair. This method is used if an endovascular repair is not possible. Document Released: 02/09/2005 Document Revised: 08/27/2012  Document Reviewed: 06/01/2012 [facility] Patient Information 2015 [place], [place]. This information is not intended to replace advice given to you by your health care provider. Make sure you discuss any questions you have with your health care provider.   Smoking Cessation Quitting smoking is important to your health and has many advantages. However, it is not always easy to quit since nicotine is a very addictive drug. Oftentimes, people try 3 times or more before being able to quit. This document explains the best ways for you to prepare to quit smoking. Quitting takes hard work and a lot of effort, but you can do it. ADVANTAGES OF QUITTING SMOKING  You will live longer, feel better, and live better.  Your body will feel the impact of quitting smoking almost immediately.  Within 20 minutes, blood pressure decreases. Your pulse returns to its normal level.  After 8 hours, carbon monoxide levels in the blood return to normal. Your oxygen level increases.  After 24 hours, the chance of having a heart attack starts to decrease. Your breath, hair, and body stop smelling like smoke.  After 48 hours, damaged nerve endings begin to recover. Your sense of taste and smell improve.  After 72 hours, the body is virtually free of nicotine. Your bronchial tubes relax and breathing becomes easier.  After 2 to 12 weeks, lungs can hold more air. Exercise becomes easier and circulation improves.  The risk of having a heart attack, stroke, cancer, or lung disease is greatly reduced.  After 1 year, the risk of coronary heart disease is cut in half.  After 5 years, the risk of stroke falls to the same as a nonsmoker.  After 10 years, the risk of lung cancer is cut in half and the risk of other cancers decreases significantly.  After 15 years, the risk of coronary heart disease drops, usually to the level of a nonsmoker.  If you are pregnant, quitting smoking will improve your chances of having a healthy  baby.  The people you live with, especially any children, will be healthier.  You will have extra money to spend on things other than cigarettes. QUESTIONS TO THINK ABOUT BEFORE ATTEMPTING TO QUIT You may want to talk about your answers with your health care provider.  Why do you want to quit?  If you tried to quit in the past, what helped and what did not?  What will be the most difficult situations for you after you quit? How will you plan to handle them?  Who can help you through the tough times? Your family? Friends? A health care provider?  What pleasures do you get from smoking? What ways can you still get pleasure if you quit? Here are some questions to ask your health care provider:  How can you help me to be successful at quitting?  What medicine do you think would be best for me and how should I take it?  What should I do if  I need more help?  What is smoking withdrawal like? How can I get information on withdrawal? GET READY  Set a quit date.  Change your environment by getting rid of all cigarettes, ashtrays, matches, and lighters in your home, car, or work. Do not let people smoke in your home.  Review your past attempts to quit. Think about what worked and what did not. GET SUPPORT AND ENCOURAGEMENT You have a better chance of being successful if you have help. You can get support in many ways.  Tell your family, friends, and coworkers that you are going to quit and need their support. Ask them not to smoke around you.  Get individual, group, or telephone counseling and support. Programs are available at [employer] and health centers. Call your local health department for information about programs in your area.  Spiritual beliefs and practices may help some smokers quit.  Download a "quit meter" on your computer to keep track of quit statistics, such as how long you have gone without smoking, cigarettes not smoked, and money saved.  Get a self-help book  about quitting smoking and staying off tobacco. [place] yourself from urges to smoke. Talk to someone, go for a walk, or occupy your time with a task.  Change your normal routine. Take a different route to work. Drink tea instead of coffee. Eat breakfast in a different place.  Reduce your stress. Take a hot bath, exercise, or read a book.  Plan something enjoyable to do every day. Reward yourself for not smoking.  Explore interactive web-based programs that specialize in helping you quit. GET MEDICINE AND USE IT CORRECTLY Medicines can help you stop smoking and decrease the urge to smoke. Combining medicine with the above behavioral methods and support can greatly increase your chances of successfully quitting smoking.  Nicotine replacement therapy helps deliver nicotine to your body without the negative effects and risks of smoking. Nicotine replacement therapy includes nicotine gum, lozenges, inhalers, nasal sprays, and skin patches. Some may be available over-the-counter and others require a prescription.  Antidepressant medicine helps people abstain from smoking, but how this works is unknown. This medicine is available by prescription.  Nicotinic receptor partial agonist medicine simulates the effect of nicotine in your brain. This medicine is available by prescription. Ask your health care provider for advice about which medicines to use and how to use them based on your health history. Your health care provider will tell you what side effects to look out for if you choose to be on a medicine or therapy. Carefully read the information on the package. Do not use any other product containing nicotine while using a nicotine replacement product.  RELAPSE OR DIFFICULT SITUATIONS Most relapses occur within the first 3 months after quitting. Do not be discouraged if you start smoking again. Remember, most people try several times before finally quitting. You may  have symptoms of withdrawal because your body is used to nicotine. You may crave cigarettes, be irritable, feel very hungry, cough often, get headaches, or have difficulty concentrating. The withdrawal symptoms are only temporary. They are strongest when you first quit, but they will go away within 10-14 days. To reduce the chances of relapse, try to:  Avoid drinking alcohol. Drinking lowers your chances of successfully quitting.  Reduce the amount of caffeine you consume. Once you quit smoking, the amount of caffeine in your body increases and can give you symptoms, such as a rapid heartbeat, sweating,  and anxiety.  Avoid smokers because they can make you want to smoke.  Do not let weight gain distract you. Many smokers will gain weight when they quit, usually less than 10 pounds. Eat a healthy diet and stay active. You can always lose the weight gained after you quit.  Find ways to improve your mood other than smoking. FOR MORE INFORMATION  www.smokefree.gov  Document Released: 04/26/2001 Document Revised: 09/16/2013 Document Reviewed: 08/11/2011 [facility] Patient Information 2015 [place], [place]. This information is not intended to replace advice given to you by your health care provider. Make sure you discuss any questions you have with your health care provider.

## 2014-03-11 DIAGNOSIS — E559 Vitamin D deficiency, unspecified: Secondary | ICD-10-CM | POA: Diagnosis not present

## 2014-03-11 DIAGNOSIS — E119 Type 2 diabetes mellitus without complications: Secondary | ICD-10-CM | POA: Diagnosis not present

## 2014-03-11 DIAGNOSIS — Z5111 Encounter for antineoplastic chemotherapy: Secondary | ICD-10-CM | POA: Diagnosis not present

## 2014-03-11 DIAGNOSIS — Z23 Encounter for immunization: Secondary | ICD-10-CM | POA: Diagnosis not present

## 2014-03-11 DIAGNOSIS — C61 Malignant neoplasm of prostate: Secondary | ICD-10-CM | POA: Diagnosis not present

## 2014-03-11 DIAGNOSIS — I10 Essential (primary) hypertension: Secondary | ICD-10-CM | POA: Diagnosis not present

## 2014-04-17 ENCOUNTER — Other Ambulatory Visit (HOSPITAL_COMMUNITY): Payer: Medicare Other

## 2014-04-17 ENCOUNTER — Ambulatory Visit: Payer: Medicare Other | Admitting: Family

## 2014-04-24 ENCOUNTER — Encounter (HOSPITAL_COMMUNITY): Payer: Self-pay | Admitting: Vascular Surgery

## 2014-05-19 ENCOUNTER — Telehealth: Payer: Self-pay | Admitting: Oncology

## 2014-05-19 NOTE — Telephone Encounter (Signed)
pt called to cx appt.....done....pt will call back to r/s

## 2014-05-20 ENCOUNTER — Ambulatory Visit: Payer: Medicare Other | Admitting: Oncology

## 2014-05-20 ENCOUNTER — Other Ambulatory Visit: Payer: Medicare Other

## 2014-06-12 ENCOUNTER — Encounter: Payer: Self-pay | Admitting: Family

## 2014-06-13 ENCOUNTER — Encounter: Payer: Self-pay | Admitting: Family

## 2014-06-13 ENCOUNTER — Ambulatory Visit (INDEPENDENT_AMBULATORY_CARE_PROVIDER_SITE_OTHER): Payer: Medicare Other | Admitting: Family

## 2014-06-13 ENCOUNTER — Ambulatory Visit (INDEPENDENT_AMBULATORY_CARE_PROVIDER_SITE_OTHER)
Admission: RE | Admit: 2014-06-13 | Discharge: 2014-06-13 | Disposition: A | Discharge status: Home / Self Care | Payer: Medicare Other | Source: Ambulatory Visit | Attending: Family | Admitting: Family

## 2014-06-13 ENCOUNTER — Ambulatory Visit (HOSPITAL_COMMUNITY)
Admission: RE | Admit: 2014-06-13 | Discharge: 2014-06-13 | Disposition: A | Discharge status: Home / Self Care | Payer: Medicare Other | Source: Ambulatory Visit | Attending: Family | Admitting: Family

## 2014-06-13 VITALS — BP 119/67 | HR 58 | Resp 16 | Ht 67.0 in | Wt 217.0 lb

## 2014-06-13 DIAGNOSIS — Z48812 Encounter for surgical aftercare following surgery on the circulatory system: Secondary | ICD-10-CM | POA: Insufficient documentation

## 2014-06-13 DIAGNOSIS — I6523 Occlusion and stenosis of bilateral carotid arteries: Secondary | ICD-10-CM | POA: Insufficient documentation

## 2014-06-13 DIAGNOSIS — Z95828 Presence of other vascular implants and grafts: Secondary | ICD-10-CM

## 2014-06-13 DIAGNOSIS — I714 Abdominal aortic aneurysm, without rupture, unspecified: Secondary | ICD-10-CM

## 2014-06-13 DIAGNOSIS — I6521 Occlusion and stenosis of right carotid artery: Secondary | ICD-10-CM | POA: Diagnosis not present

## 2014-06-13 DIAGNOSIS — Z01810 Encounter for preprocedural cardiovascular examination: Secondary | ICD-10-CM | POA: Diagnosis not present

## 2014-06-13 DIAGNOSIS — F17200 Nicotine dependence, unspecified, uncomplicated: Secondary | ICD-10-CM

## 2014-06-13 DIAGNOSIS — Z720 Tobacco use: Secondary | ICD-10-CM

## 2014-06-13 NOTE — Patient Instructions (Addendum)
Stroke Prevention Some medical conditions and behaviors are associated with an increased chance of having a stroke. You may prevent a stroke by making healthy choices and managing medical conditions. HOW CAN I REDUCE MY RISK OF HAVING A STROKE?   Stay physically active. Get at least 30 minutes of activity on most or all days.  Do not smoke. It may also be helpful to avoid exposure to secondhand smoke.  Limit alcohol use. Moderate alcohol use is considered to be:  No more than 2 drinks per day for men.  No more than 1 drink per day for nonpregnant women.  Eat healthy foods. This involves:  Eating 5 or more servings of fruits and vegetables a day.  Making dietary changes that address high blood pressure (hypertension), high cholesterol, diabetes, or obesity.  Manage your cholesterol levels.  Making food choices that are high in fiber and low in saturated fat, trans fat, and cholesterol may control cholesterol levels.  Take any prescribed medicines to control cholesterol as directed by your health care provider.  Manage your diabetes.  Controlling your carbohydrate and sugar intake is recommended to manage diabetes.  Take any prescribed medicines to control diabetes as directed by your health care provider.  Control your hypertension.  Making food choices that are low in salt (sodium), saturated fat, trans fat, and cholesterol is recommended to manage hypertension.  Take any prescribed medicines to control hypertension as directed by your health care provider.  Maintain a healthy weight.  Reducing calorie intake and making food choices that are low in sodium, saturated fat, trans fat, and cholesterol are recommended to manage weight.  Stop drug abuse.  Avoid taking birth control pills.  Talk to your health care provider about the risks of taking birth control pills if you are over 35 years old, smoke, get migraines, or have ever had a blood clot.  Get evaluated for sleep  disorders (sleep apnea).  Talk to your health care provider about getting a sleep evaluation if you snore a lot or have excessive sleepiness.  Take medicines only as directed by your health care provider.  For some people, aspirin or blood thinners (anticoagulants) are helpful in reducing the risk of forming abnormal blood clots that can lead to stroke. If you have the irregular heart rhythm of atrial fibrillation, you should be on a blood thinner unless there is a good reason you cannot take them.  Understand all your medicine instructions.  Make sure that other conditions (such as anemia or atherosclerosis) are addressed. SEEK IMMEDIATE MEDICAL CARE IF:   You have sudden weakness or numbness of the face, arm, or leg, especially on one side of the body.  Your face or eyelid droops to one side.  You have sudden confusion.  You have trouble speaking (aphasia) or understanding.  You have sudden trouble seeing in one or both eyes.  You have sudden trouble walking.  You have dizziness.  You have a loss of balance or coordination.  You have a sudden, severe headache with no known cause.  You have new chest pain or an irregular heartbeat. Any of these symptoms may represent a serious problem that is an emergency. Do not wait to see if the symptoms will go away. Get medical help at once. Call your local emergency services (911 in [place] Do not drive yourself to the hospital. Document Released: 06/09/2004 Document Revised: 09/16/2013 Document Reviewed: 11/02/2012 [employer] Patient Information 2015 [employer]. This information is not intended to replace advice given   to you by your health care provider. Make sure you discuss any questions you have with your health care provider.   Smoking Cessation Quitting smoking is important to your health and has many advantages. However, it is not always easy to quit since nicotine is a very addictive drug. Oftentimes, people try 3 times or more  before being able to quit. This document explains the best ways for you to prepare to quit smoking. Quitting takes hard work and a lot of effort, but you can do it. ADVANTAGES OF QUITTING SMOKING  You will live longer, feel better, and live better.  Your body will feel the impact of quitting smoking almost immediately.  Within 20 minutes, blood pressure decreases. Your pulse returns to its normal level.  After 8 hours, carbon monoxide levels in the blood return to normal. Your oxygen level increases.  After 24 hours, the chance of having a heart attack starts to decrease. Your breath, hair, and body stop smelling like smoke.  After 48 hours, damaged nerve endings begin to recover. Your sense of taste and smell improve.  After 72 hours, the body is virtually free of nicotine. Your bronchial tubes relax and breathing becomes easier.  After 2 to 12 weeks, lungs can hold more air. Exercise becomes easier and circulation improves.  The risk of having a heart attack, stroke, cancer, or lung disease is greatly reduced.  After 1 year, the risk of coronary heart disease is cut in half.  After 5 years, the risk of stroke falls to the same as a nonsmoker.  After 10 years, the risk of lung cancer is cut in half and the risk of other cancers decreases significantly.  After 15 years, the risk of coronary heart disease drops, usually to the level of a nonsmoker.  If you are pregnant, quitting smoking will improve your chances of having a healthy baby.  The people you live with, especially any children, will be healthier.  You will have extra money to spend on things other than cigarettes. QUESTIONS TO THINK ABOUT BEFORE ATTEMPTING TO QUIT You may want to talk about your answers with your health care provider.  Why do you want to quit?  If you tried to quit in the past, what helped and what did not?  What will be the most difficult situations for you after you quit? How will you plan to  handle them?  Who can help you through the tough times? Your family? Friends? A health care provider?  What pleasures do you get from smoking? What ways can you still get pleasure if you quit? Here are some questions to ask your health care provider:  How can you help me to be successful at quitting?  What medicine do you think would be best for me and how should I take it?  What should I do if I need more help?  What is smoking withdrawal like? How can I get information on withdrawal? GET READY  Set a quit date.  Change your environment by getting rid of all cigarettes, ashtrays, matches, and lighters in your home, car, or work. Do not let people smoke in your home.  Review your past attempts to quit. Think about what worked and what did not. GET SUPPORT AND ENCOURAGEMENT You have a better chance of being successful if you have help. You can get support in many ways.  Tell your family, friends, and coworkers that you are going to quit and need their support. Ask them not   to smoke around you.  Get individual, group, or telephone counseling and support. Programs are available at local hospitals and health centers. Call your local health department for information about programs in your area.  Spiritual beliefs and practices may help some smokers quit.  Download a "quit meter" on your computer to keep track of quit statistics, such as how long you have gone without smoking, cigarettes not smoked, and money saved.  Get a self-help book about quitting smoking and staying off tobacco. LEARN NEW SKILLS AND BEHAVIORS  Distract yourself from urges to smoke. Talk to someone, go for a walk, or occupy your time with a task.  Change your normal routine. Take a different route to work. Drink tea instead of coffee. Eat breakfast in a different place.  Reduce your stress. Take a hot bath, exercise, or read a book.  Plan something enjoyable to do every day. Reward yourself for not  smoking.  Explore interactive web-based programs that specialize in helping you quit. GET MEDICINE AND USE IT CORRECTLY Medicines can help you stop smoking and decrease the urge to smoke. Combining medicine with the above behavioral methods and support can greatly increase your chances of successfully quitting smoking.  Nicotine replacement therapy helps deliver nicotine to your body without the negative effects and risks of smoking. Nicotine replacement therapy includes nicotine gum, lozenges, inhalers, nasal sprays, and skin patches. Some may be available over-the-counter and others require a prescription.  Antidepressant medicine helps people abstain from smoking, but how this works is unknown. This medicine is available by prescription.  Nicotinic receptor partial agonist medicine simulates the effect of nicotine in your brain. This medicine is available by prescription. Ask your health care provider for advice about which medicines to use and how to use them based on your health history. Your health care provider will tell you what side effects to look out for if you choose to be on a medicine or therapy. Carefully read the information on the package. Do not use any other product containing nicotine while using a nicotine replacement product.  RELAPSE OR DIFFICULT SITUATIONS Most relapses occur within the first 3 months after quitting. Do not be discouraged if you start smoking again. Remember, most people try several times before finally quitting. You may have symptoms of withdrawal because your body is used to nicotine. You may crave cigarettes, be irritable, feel very hungry, cough often, get headaches, or have difficulty concentrating. The withdrawal symptoms are only temporary. They are strongest when you first quit, but they will go away within 10-14 days. To reduce the chances of relapse, try to:  Avoid drinking alcohol. Drinking lowers your chances of successfully quitting.  Reduce the  amount of caffeine you consume. Once you quit smoking, the amount of caffeine in your body increases and can give you symptoms, such as a rapid heartbeat, sweating, and anxiety.  Avoid smokers because they can make you want to smoke.  Do not let weight gain distract you. Many smokers will gain weight when they quit, usually less than 10 pounds. Eat a healthy diet and stay active. You can always lose the weight gained after you quit.  Find ways to improve your mood other than smoking. FOR MORE INFORMATION  www.smokefree.gov  Document Released: 04/26/2001 Document Revised: 09/16/2013 Document Reviewed: 08/11/2011 [employer] Patient Information 2015 [employer]. This information is not intended to replace advice given to you by your health care provider. Make sure you discuss any questions you have with your health care   provider.    Smoking Cessation, Tips for Success If you are ready to quit smoking, congratulations! You have chosen to help yourself be healthier. Cigarettes bring nicotine, tar, carbon monoxide, and other irritants into your body. Your lungs, heart, and blood vessels will be able to work better without these poisons. There are many different ways to quit smoking. Nicotine gum, nicotine patches, a nicotine inhaler, or nicotine nasal spray can help with physical craving. Hypnosis, support groups, and medicines help break the habit of smoking. WHAT THINGS CAN I DO TO MAKE QUITTING EASIER?  Here are some tips to help you quit for good:  Pick a date when you will quit smoking completely. Tell all of your friends and family about your plan to quit on that date.  Do not try to slowly cut down on the number of cigarettes you are smoking. Pick a quit date and quit smoking completely starting on that day.  Throw away all cigarettes.   Clean and remove all ashtrays from your home, work, and car.  On a card, write down your reasons for quitting. Carry the card with you and read it  when you get the urge to smoke.  Cleanse your body of nicotine. Drink enough water and fluids to keep your urine clear or pale yellow. Do this after quitting to flush the nicotine from your body.  Learn to predict your moods. Do not let a bad situation be your excuse to have a cigarette. Some situations in your life might tempt you into wanting a cigarette.  Never have "just one" cigarette. It leads to wanting another and another. Remind yourself of your decision to quit.  Change habits associated with smoking. If you smoked while driving or when feeling stressed, try other activities to replace smoking. Stand up when drinking your coffee. Brush your teeth after eating. Sit in a different chair when you read the paper. Avoid alcohol while trying to quit, and try to drink fewer caffeinated beverages. Alcohol and caffeine may urge you to smoke.  Avoid foods and drinks that can trigger a desire to smoke, such as sugary or spicy foods and alcohol.  Ask people who smoke not to smoke around you.  Have something planned to do right after eating or having a cup of coffee. For example, plan to take a walk or exercise.  Try a relaxation exercise to calm you down and decrease your stress. Remember, you may be tense and nervous for the first 2 weeks after you quit, but this will pass.  Find new activities to keep your hands busy. Play with a pen, coin, or rubber band. Doodle or draw things on paper.  Brush your teeth right after eating. This will help cut down on the craving for the taste of tobacco after meals. You can also try mouthwash.   Use oral substitutes in place of cigarettes. Try using lemon drops, carrots, cinnamon sticks, or chewing gum. Keep them handy so they are available when you have the urge to smoke.  When you have the urge to smoke, try deep breathing.  Designate your home as a nonsmoking area.  If you are a heavy smoker, ask your health care provider about a prescription for  nicotine chewing gum. It can ease your withdrawal from nicotine.  Reward yourself. Set aside the cigarette money you save and buy yourself something nice.  Look for support from others. Join a support group or smoking cessation program. Ask someone at home or at work to  help you with your plan to quit smoking.  Always ask yourself, "Do I need this cigarette or is this just a reflex?" Tell yourself, "Today, I choose not to smoke," or "I do not want to smoke." You are reminding yourself of your decision to quit.  Do not replace cigarette smoking with electronic cigarettes (commonly called e-cigarettes). The safety of e-cigarettes is unknown, and some may contain harmful chemicals.  If you relapse, do not give up! Plan ahead and think about what you will do the next time you get the urge to smoke. HOW WILL I FEEL WHEN I QUIT SMOKING? You may have symptoms of withdrawal because your body is used to nicotine (the addictive substance in cigarettes). You may crave cigarettes, be irritable, feel very hungry, cough often, get headaches, or have difficulty concentrating. The withdrawal symptoms are only temporary. They are strongest when you first quit but will go away within 10-14 days. When withdrawal symptoms occur, stay in control. Think about your reasons for quitting. Remind yourself that these are signs that your body is healing and getting used to being without cigarettes. Remember that withdrawal symptoms are easier to treat than the major diseases that smoking can cause.  Even after the withdrawal is over, expect periodic urges to smoke. However, these cravings are generally short lived and will go away whether you smoke or not. Do not smoke! WHAT RESOURCES ARE AVAILABLE TO HELP ME QUIT SMOKING? Your health care provider can direct you to community resources or hospitals for support, which may include:  Group support.  Education.  Hypnosis.  Therapy. Document Released: 01/29/2004 Document  Revised: 09/16/2013 Document Reviewed: 10/18/2012 [facility] Patient Information 2015 [place], [place]. This information is not intended to replace advice given to you by your health care provider. Make sure you discuss any questions you have with your health care provider.   Carotid Endarterectomy  A carotid endarterectomy is a surgery to remove a blockage in the carotid arteries. The carotid arteries are the large blood vessels on both sides of the neck that supply blood to the brain. Carotid artery disease, also called carotid artery stenosis, is the narrowing or blockage of one or both carotid arteries. Carotid artery disease is usually caused by atherosclerosis, which is a buildup of fat and plaque in the arteries. Some plaque buildup normally occurs with aging. The plaque may partially or totally block blood flow or cause a clot to form in the carotid arteries.  LET [facility] CARE PROVIDER KNOW ABOUT:  Any allergies you have.   All medicines you are taking, including vitamins, herbs, eye drops, creams, and over-the-counter medicines.   Use of steroids (by mouth or creams).   Previous problems you or members of your family have had with the use of anesthetics.   Any blood disorders you have.   Previous surgeries you have had.   Medical conditions you have, including diabetes and kidney problems.   Possibility of pregnancy, if this applies.  RISKS AND COMPLICATIONS Generally, carotid endarterectomy is a safe procedure. However, problems can occur and include:  Bleeding.   Infection.   Transient ischemic attacks (TIAs). A TIA results from poor blood flow to the brain, but there is no permanent loss of brain function.   Stroke.   Heart attack (myocardial infarction).   High blood pressure (hypertension).   Injury to nerves near the carotid arteries.  BEFORE THE PROCEDURE   Ask your health care provider about changing or stopping your regular medicines. This  is especially important if you are taking diabetes medicines or blood thinners.  Do not eat or drink anything after midnight on the night before the procedure or as directed by your health care provider. Ask your health care provider if it is okay to take any needed medicines with a sip of water.   Do not smoke for as long as possible before the surgery. Smoking will increase the chance of a healing problem after surgery.   You may need to have blood tests, a test to check heart rhythm (electrocardiography), or a test to check blood flow (angiography) done before the surgery.  PROCEDURE   You will be given a medicine that makes you fall asleep (general anesthetic).  After you receive the anesthetic, the surgeon will make a small cut (incision) in your neck to expose the carotid artery.  A tube may be inserted into the carotid artery above and below the blockage. This tube will temporarily allow blood to flow around the blockage during the surgery.  An incision will be made in the carotid artery at the location of the blockage, and the blockage will then be removed. In some cases, a section of the carotid artery may be removed and a graft patch used to repair the artery.  The carotid artery will then be closed with stitches (sutures).  If a tube was inserted into the artery to allow blood flow around the blockage during surgery, the tube will be removed. With the tube removal, blood flow to the brain will be restored through the carotid artery.  The incision in the neck will then be closed with sutures. AFTER THE PROCEDURE  You may have some pain or an ache in your neck for a couple weeks. This is normal.  Document Released: 01/02/2013 Document Revised: 09/16/2013 Document Reviewed: 01/02/2013 [facility] Patient Information 2015 [place], [place]. This information is not intended to replace advice given to you by your health care provider. Make sure you discuss any questions you have with  your health care provider.

## 2014-06-13 NOTE — Progress Notes (Signed)
[facility] [place]  Established EVAR  History of Present Illness  Jason Henderson is a 74 y.o. (Mar 05, 1941) male  patient of Dr. 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 He underwent percutaneous endovascular aneurysm repair using 3 components on 04/06/2012.  He also has carotid artery stenosis. He returns today for carotid artery and EVAR surveillance. The patient does denies back or abdominal pain. The patient is a smoker.  Walking 4-5 days/week.  The patient denies claudication in legs with walking, denies non healing wounds.  The patient denies history of stroke or TIA symptoms, denies dizziness.   Pt Diabetic: No  Pt smoker: smoker (1 ppd x 12 yrs)  Pt meds include: Statin :Yes ASA: Yes Other anticoagulants/antiplatelets: no   Past Medical History  Diagnosis Date  . Hypertension   . Abdominal aneurysm without mention of rupture   . Hyperlipidemia   . Prostate cancer   . Chronic kidney disease     renal insufficiency R side, stent placement   Past Surgical History  Procedure Laterality Date  . Transurethral resection of prostate    . Tonsillectomy and adenoidectomy    . Abdominal aortic endovascular stent graft  04/06/2012    Procedure: ABDOMINAL AORTIC ENDOVASCULAR STENT GRAFT;  Surgeon: Angelia Mould, MD;  Location: [place];  Service: Vascular;  Laterality: N/A;  GORE; ultrasound guided.  . Abdominal aortagram N/A 04/02/2012    Procedure: ABDOMINAL Maxcine Ham;  Surgeon: Angelia Mould, MD;  Location: [facility] CATH LAB;  Service: Cardiovascular;  Laterality: N/A;  . Prostate surgery     Social History History  Substance Use Topics  . Smoking status: Light Tobacco Smoker -- 1.00 packs/day    Types: Cigarettes  . Smokeless tobacco: Never Used     Comment: He has the patches  . Alcohol Use: No   Family History Family History  Problem Relation Age of Onset  . Cancer Mother     Lung  . Cancer Father    Colon   Current Outpatient Prescriptions on File Prior to Visit  Medication Sig Dispense Refill  . aspirin 81 MG EC tablet Take 81 mg by mouth daily.      Marland Kitchen atorvastatin (LIPITOR) 80 MG tablet Take 80 mg by mouth daily.    . bicalutamide (CASODEX) 50 MG tablet Take 50 mg by mouth daily.     . Calcium Carbonate-Vitamin D (CALTRATE 600+D PO) Take 1 tablet by mouth daily.    . Cholecalciferol (VITAMIN D3) 2000 UNITS TABS Take 2,000 Int'l Units by mouth. Alternates 1 tablet one day, then 2 tablets the next day    . doxazosin (CARDURA) 2 MG tablet Take 1 mg by mouth daily.     Marland Kitchen lisinopril (PRINIVIL,ZESTRIL) 20 MG tablet Take 10 mg by mouth daily.     Marland Kitchen oxybutynin (DITROPAN-XL) 10 MG 24 hr tablet Take 10 mg by mouth at bedtime.     No current facility-administered medications on file prior to visit.   No Known Allergies   ROS: See HPI for pertinent positives and negatives.  Physical Examination  Filed Vitals:   06/13/14 1112 06/13/14 1116  BP: 116/64 119/67  Pulse: 61 58  Resp:  16  Height:  5\' 7"  (1.702 m)  Weight:  217 lb (98.431 kg)  SpO2:  95%   Body mass index is 33.98 kg/(m^2).  General: A&O x 3, WD, Obese male.  Pulmonary: Sym exp, good air movt, CTAB, no rales, rhonchi, or wheezing.  Cardiac: RRR, Nl S1, S2, + Murmur detected.   Carotid Bruits  Left  Right    Positive Positive    Aorta is is not palpable.  Radial pulses are 2+ and =.   VASCULAR EXAM:  LE Pulses  LEFT  RIGHT   FEMORAL  palpable   palpable   POPLITEAL  not palpable  not palpable   POSTERIOR TIBIAL  not palpable  palpable   DORSALIS PEDIS  ANTERIOR TIBIAL  palpable  palpable   Gastrointestinal: soft, NTND, -G/R, - HSM, - masses, - CVAT B.  Musculoskeletal: M/S 5/5 throughout, Extremities without ischemic changes.  Neurologic: CN 2-12 intact, Pain and light touch intact in extremities, Motor exam as listed above.    Non-Invasive Vascular Imaging  EVAR Duplex  (Date: 06/13/2014)  ABDOMINAL AORTA DUPLEX EVALUATION - POST ENDOVASCULAR REPAIR    INDICATION: Follow-up endovascular aortic repair     PREVIOUS INTERVENTION(S): Stent graft repair of abdominal aortic aneurysm performed 04/06/2012    DUPLEX EXAM:      DIAMETER AP (cm) DIAMETER TRANSVERSE (cm) VELOCITIES (cm/sec)  Aorta 4.35 4.33 80  Right Common Iliac 1.58  117  Left Common Iliac 1.95  77    Comparison Study  [place]     Date DIAMETER AP (cm) DIAMETER TRANSVERSE (cm)  06/19/2013 4.46 4.46     ADDITIONAL FINDINGS:     IMPRESSION: Patent stent graft repair of abdominal aortic aneurysm with stable residual sac measurements of 4.35cm.    Compared to the previous exam:  Slight decrease compared to previous exam.     CEREBROVASCULAR DUPLEX EVALUATION (06/13/2014)    INDICATION: Follow-up carotid disease     PREVIOUS INTERVENTION(S):     DUPLEX EXAM:     RIGHT  LEFT  Peak Systolic Velocities (cm/s) End Diastolic Velocities (cm/s) Plaque LOCATION Peak Systolic Velocities (cm/s) End Diastolic Velocities (cm/s) Plaque  107 24  CCA PROXIMAL 157 33   97 23  CCA MID 101 30   97 22  CCA DISTAL 92 26   90 12  ECA 92 18   367 127 HT ICA PROXIMAL 113 41 HT  123 34  ICA MID 108 39   78 26  ICA DISTAL 94 34     3.8 ICA / CCA Ratio (PSV) 1.2  Antegrade  Vertebral Flow Antegrade    Brachial Systolic Pressure (mmHg)   Within normal limits  Brachial Artery Waveforms Within normal limits     Plaque Morphology:  HM = Homogeneous, HT = Heterogeneous, CP = Calcific Plaque, SP = Smooth Plaque, IP = Irregular Plaque     ADDITIONAL FINDINGS:     IMPRESSION: 1. Evidence of 80%-99% stenosis of the right internal carotid artery. Vessel appears normal beyond the lesion which is isolated to the proximal 2cm of the internal carotid artery. 2. Evidence of <40% stenosis of the left internal carotid artery. Although velocities suggest higher, visible disease does not support a greater category and may  be elevated due to compensatory flow. 3. Bilateral vertebral artery is antegrade.     Compared to the previous exam:  Increase of right internal carotid artery stenosis.    Medical Decision Making  Jason Henderson is a 74 y.o. (11/04/1940) male  patient of Dr. 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 He underwent percutaneous endovascular aneurysm repair using 3 components on 04/06/2012.  He also has carotid artery stenosis with no history of stroke or TIA.  Today's EVAR Duplex reveals a patent  stent graft repair of abdominal aortic aneurysm with stable residual sac measurements of 4.35cm, slight decrease compared to previous Duplex.  Today's carotid Duplex reveals evidence of 80%-99% stenosis of the right internal carotid artery. Vessel appears normal beyond the lesion which is isolated to the proximal 2cm of the internal carotid artery. Evidence of <40% stenosis of the left internal carotid artery. Although velocities suggest higher, visible disease does not support a greater category and may be elevated due to compensatory flow. Increase of right internal carotid artery stenosis compared to the previous Duplex six months prior.  Fortunately the patient does not have DM, but unfortunately he continues to smoke, a major atherosclerotic risk factor.  The patient was counseled re smoking cessation and given several free resources re smoking cessation.  Face to face time with patient was 30 minutes. Over 50% of this time was spent on counseling and coordination of care.   Discussed with Dr. Bridgett Larsson.  Cardiac evaluation prior to right CEA, follow up with Dr. Scot Dock in 2 weeks to discuss right CEA. He saw Dr. Percival Spanish for cardiac evaliuation, had stress testing in November 2013 in preparation for the EVAR, refer to Dr. Percival Spanish.   I emphasized the importance of maximal medical management including strict control of blood pressure, blood glucose, and lipid levels,  antiplatelet agents, obtaining regular exercise, and cessation of smoking.  The patient was given information about stroke prevention and what symptoms should prompt the patient to seek immediate medical care.  Clemon Chambers, RN, MSN, FNP-C [facility] [place] Office: [phone]  Clinic Physician: Bridgett Larsson  06/13/2014, 11:36 AM

## 2014-06-20 ENCOUNTER — Other Ambulatory Visit (HOSPITAL_COMMUNITY): Payer: Medicare Other

## 2014-06-20 ENCOUNTER — Ambulatory Visit: Payer: Medicare Other | Admitting: Family

## 2014-06-20 ENCOUNTER — Telehealth: Payer: Self-pay

## 2014-06-20 NOTE — Telephone Encounter (Signed)
PT WOULD LIKE TO KNOW IF HE NEEDS TO FAST FOR ANY LAB WORK FOR HIS APPT ON MON 2//8/206 WITH DR [facility]

## 2014-06-23 ENCOUNTER — Ambulatory Visit (INDEPENDENT_AMBULATORY_CARE_PROVIDER_SITE_OTHER): Payer: Medicare Other | Admitting: Cardiology

## 2014-06-23 ENCOUNTER — Encounter: Payer: Self-pay | Admitting: Cardiology

## 2014-06-23 VITALS — BP 120/58 | HR 74 | Ht 67.0 in | Wt 217.8 lb

## 2014-06-23 DIAGNOSIS — I10 Essential (primary) hypertension: Secondary | ICD-10-CM | POA: Diagnosis not present

## 2014-06-23 DIAGNOSIS — I6521 Occlusion and stenosis of right carotid artery: Secondary | ICD-10-CM

## 2014-06-23 NOTE — Progress Notes (Signed)
HPI  The patient presents for preop clearance.   He is being considered for CEA with right 80 - 99% stenosis.   I saw him in 2013. At that time he was being considered for endovascular stent graft repair. Prior to that he had had a stress perfusion study. This demonstrated an moderate sized inferior perfusion defect with partial reversibility. However, his EF was 71%. He was not having any unstable symptoms. He did have his procedure without cardiac complications. Since that time he reports that he has done well without cardiovascular complications. He lives on the second floor and he walks up a flight of stairs carrying things. He goes up and down the stairs several times a day. With this he denies any cardiovascular symptoms. He denies any chest pressure, neck or arm discomfort. He has no palpitations, presyncope or syncope. He does have some dyspnea but he just recently quit smoking cigarettes again. He's not having any PND or orthopnea. He has no weight gain or edema. He does have some pain in his buttocks with walking that could be claudication.  No Known Allergies  Current Outpatient Prescriptions  Medication Sig Dispense Refill  . aspirin 81 MG EC tablet Take 81 mg by mouth daily.      Marland Kitchen atorvastatin (LIPITOR) 80 MG tablet Take 80 mg by mouth daily.    . bicalutamide (CASODEX) 50 MG tablet Take 50 mg by mouth daily.     . Calcium Carbonate-Vitamin D (CALTRATE 600+D PO) Take 1 tablet by mouth daily.    . Cholecalciferol (VITAMIN D3) 2000 UNITS TABS Take 2,000 Int'l Units by mouth. Alternates 1 tablet one day, then 2 tablets the next day    . doxazosin (CARDURA) 2 MG tablet Take 1 mg by mouth daily.     Marland Kitchen lisinopril (PRINIVIL,ZESTRIL) 20 MG tablet Take 10 mg by mouth daily.     Marland Kitchen oxybutynin (DITROPAN-XL) 10 MG 24 hr tablet Take 10 mg by mouth at bedtime.     No current facility-administered medications for this visit.    Past Medical History  Diagnosis Date  . Hypertension   .  Abdominal aneurysm without mention of rupture   . Hyperlipidemia   . Prostate cancer   . Chronic kidney disease     renal insufficiency R side, stent placement    Past Surgical History  Procedure Laterality Date  . Transurethral resection of prostate    . Tonsillectomy and adenoidectomy    . Abdominal aortic endovascular stent graft  04/06/2012    Procedure: ABDOMINAL AORTIC ENDOVASCULAR STENT GRAFT;  Surgeon: Angelia Mould, MD;  Location: [place];  Service: Vascular;  Laterality: N/A;  GORE; ultrasound guided.  . Abdominal aortagram N/A 04/02/2012    Procedure: ABDOMINAL Maxcine Ham;  Surgeon: Angelia Mould, MD;  Location: [facility] CATH LAB;  Service: Cardiovascular;  Laterality: N/A;  . Prostate surgery      ROS:  As stated in the HPI and negative for all other systems.  PHYSICAL EXAM There were no vitals taken for this visit. GENERAL:  Well appearing HEENT:  Pupils equal round and reactive, fundi not visualized, oral mucosa unremarkable NECK:  No jugular venous distention, waveform within normal limits, carotid upstroke brisk and symmetric, bilateral carotid bruits, no thyromegaly LYMPHATICS:  No cervical, inguinal adenopathy LUNGS:  Clear to auscultation bilaterally BACK:  No CVA tenderness CHEST:  Unremarkable HEART:  PMI not displaced or sustained,S1 and S2 within normal limits, no S3, no S4, no clicks, no rubs,  soft apical nonradiating systolic murmur, no diastolic murmurs ABD:  Flat, positive bowel sounds normal in frequency in pitch, no bruits, no rebound, no guarding, positive midline pulsatile mass, no hepatomegaly, no splenomegaly EXT:  2 plus pulses upper, diminished dorsalis pedis and posterior tibialis bilaterally, no edema, no cyanosis no clubbing SKIN:  No rashes no nodules NEURO:  Cranial nerves II through XII grossly intact, motor grossly intact throughout PSYCH:  Cognitively intact, oriented to person place and time  EKG:  Sinus rhythm, rate 74, axis  within normal limits, intervals within normal limits, no acute ST-T wave changes.  06/23/2014  ASSESSMENT AND PLAN  PRE OP I have extensively reviewed the previous perfusion study. There was an inferior defect with some partial reversibility. However, had a well preserved ejection fraction and no symptoms with a high functional status. This is still the case. He is able to achieve greater than 5 METS of activity routinely. He has no symptoms. He has no high-risk physical findings. He is not going for procedure that is high risk from a cardiovascular standpoint. Therefore, based on ACC/AHA guidelines, the patient would be at acceptable risk for the planned procedure without further cardiovascular testing.  HTN His blood pressure is controlled. He will continue the meds as listed.  TOBACCO ABUSE He is now in the process of quitting smoking again. I hope he can be compliant with this.

## 2014-06-23 NOTE — Patient Instructions (Signed)
Your physician recommends that you schedule a follow-up appointment in: As needed with Dr. Percival Spanish

## 2014-06-24 ENCOUNTER — Encounter: Payer: Self-pay | Admitting: Cardiology

## 2014-06-25 ENCOUNTER — Encounter: Payer: Self-pay | Admitting: Vascular Surgery

## 2014-06-25 ENCOUNTER — Other Ambulatory Visit (HOSPITAL_COMMUNITY): Payer: Medicare Other

## 2014-06-25 ENCOUNTER — Ambulatory Visit: Payer: Medicare Other | Admitting: Family

## 2014-06-26 ENCOUNTER — Ambulatory Visit (INDEPENDENT_AMBULATORY_CARE_PROVIDER_SITE_OTHER): Payer: Medicare Other | Admitting: Vascular Surgery

## 2014-06-26 ENCOUNTER — Encounter: Payer: Self-pay | Admitting: Vascular Surgery

## 2014-06-26 VITALS — BP 101/57 | HR 59 | Temp 98.1°F | Resp 14 | Ht 66.0 in | Wt 211.6 lb

## 2014-06-26 DIAGNOSIS — I6523 Occlusion and stenosis of bilateral carotid arteries: Secondary | ICD-10-CM

## 2014-06-26 NOTE — Progress Notes (Signed)
Patient ID: Jason Henderson, male   DOB: 07-Nov-1940, 74 y.o.   MRN: [id number]  Reason for Consult: greater than 80% right carotid stenosis   Primary care physician: Rachell Cipro, MD  Subjective:     HPI:  Jason Henderson is a 74 y.o. male Jason Henderson undergone previous percutaneous endovascular aneurysm repair in November 2013 for a 5.7 cm infrarenal abdominal aortic aneurysm. He was seen in our office on 06/13/2014 for a routine follow up visit. His aneurysm was continuing to shrink in size and he was doing well from that standpoint. He also had some moderate right carotid stenosis and a follow up duplex showed that the stenosis on the right had progressed significantly. He now has a greater than 80% right carotid stenosis with a less than 50% left carotid stenosis.  He is right-handed. He denies any history of stroke, TIAs, expressive or receptive aphasia, or amaurosis fugax.  He is not a smoker. He is on aspirin. He is on a statin.  He has been seen by Dr. Marijo File and the patient tells me that he has been cleared for surgery.  Past Medical History  Diagnosis Date  . Hypertension   . Abdominal aneurysm without mention of rupture   . Hyperlipidemia   . Prostate cancer   . Chronic kidney disease     renal insufficiency R side, stent placement   Family History  Problem Relation Age of Onset  . Cancer Mother     Lung  . Cancer Father     Colon   Past Surgical History  Procedure Laterality Date  . Transurethral resection of prostate    . Tonsillectomy and adenoidectomy    . Abdominal aortic endovascular stent graft  04/06/2012    Procedure: ABDOMINAL AORTIC ENDOVASCULAR STENT GRAFT;  Surgeon: Angelia Mould, MD;  Location: [place];  Service: Vascular;  Laterality: N/A;  GORE; ultrasound guided.  . Abdominal aortagram N/A 04/02/2012    Procedure: ABDOMINAL Maxcine Ham;  Surgeon: Angelia Mould, MD;  Location: [facility] CATH LAB;  Service: Cardiovascular;  Laterality:  N/A;  . Prostate surgery      Short Social History:  History  Substance Use Topics  . Smoking status: Former Smoker    Types: Cigarettes    Quit date: 06/13/2014  . Smokeless tobacco: Never Used     Comment: He has used patches x 2 weeks  . Alcohol Use: No    No Known Allergies  Current Outpatient Prescriptions  Medication Sig Dispense Refill  . aspirin 81 MG EC tablet Take 81 mg by mouth daily.      Marland Kitchen atorvastatin (LIPITOR) 80 MG tablet Take 80 mg by mouth daily.    . bicalutamide (CASODEX) 50 MG tablet Take 50 mg by mouth daily.     . Calcium Carbonate-Vitamin D (CALTRATE 600+D PO) Take 1 tablet by mouth daily.    . Cholecalciferol (VITAMIN D3) 2000 UNITS TABS Take 2,000 Int'l Units by mouth. Alternates 1 tablet one day, then 2 tablets the next day    . doxazosin (CARDURA) 2 MG tablet Take 1 mg by mouth daily.     Marland Kitchen lisinopril (PRINIVIL,ZESTRIL) 20 MG tablet Take 10 mg by mouth daily.     Marland Kitchen oxybutynin (DITROPAN-XL) 10 MG 24 hr tablet Take 10 mg by mouth at bedtime.     No current facility-administered medications for this visit.    Review of Systems  Constitutional: Negative for chills and fever.  Eyes: Negative for loss of  vision.  Respiratory: Negative for cough and wheezing.  Cardiovascular: Negative for chest pain, chest tightness, claudication, dyspnea with exertion, orthopnea and palpitations.  GI: Negative for blood in stool and vomiting.  GU: Negative for dysuria and hematuria.  Musculoskeletal: Negative for leg pain, joint pain and myalgias.  Skin: Negative for rash and wound.  Neurological: Negative for dizziness and speech difficulty.  Hematologic: Negative for bruises/bleeds easily. Psychiatric: Negative for depressed mood.        Objective:  Objective  Filed Vitals:   06/26/14 1242 06/26/14 1245  BP: 97/56 101/57  Pulse: 54 59  Temp: 98.1 F (36.7 C)   TempSrc: Oral   Resp: 14   Height: 5\' 6"  (1.676 m)   Weight: 211 lb 9.6 oz (95.981 kg)     SpO2: 97%    Body mass index is 34.17 kg/(m^2).  Physical Exam  Constitutional: He is oriented to person, place, and time. He appears well-developed and well-nourished.  HENT:  Head: Normocephalic and atraumatic.  Neck: Neck supple. No JVD present. No thyromegaly present.  Cardiovascular: Normal rate, regular rhythm, normal heart sounds and normal pulses.  Exam reveals no friction rub.   No murmur heard. Pulses:      Femoral pulses are 2+ on the right side, and 2+ on the left side. I do not protect carotid bruits.  Pulmonary/Chest: Breath sounds normal. He has no wheezes. He has no rales.  Abdominal: Soft. Bowel sounds are normal. There is no tenderness.  Musculoskeletal: Normal range of motion. He exhibits no edema.  Lymphadenopathy:    He has no cervical adenopathy.  Neurological: He is alert and oriented to person, place, and time. He has normal strength. No sensory deficit.  Skin: No lesion and no rash noted.  Psychiatric: He has a normal mood and affect.    Data: I have reviewed his carotid duplex scan which was performed on 06/13/2014. This shows a greater than 80% right internal carotid artery stenosis. End-diastolic velocity on the right is 127 cm/s. There is a less than 40% left internal carotid artery stenosis. The stenosis appears to be in the proximal internal carotid artery. The bifurcation is at the mid hyoid. There does not appear to be significant stenosis beyond the proximal internal carotid artery.  I have reviewed his ultrasound of his abdominal aorta which was performed on 06/13/2014. This shows that the maximum diameter of his aorta is 4.35 cm which has decreased in size compared to 4.46 cm in February 2015. The aneurysm was 5.7 cm when it was repaired in November 2013.      Assessment/Plan:     GREATER THAN 80% ASYMPTOMATIC RIGHT CAROTID STENOSIS: Given the severity of the right carotid stenosis, I have recommended right carotid endarterectomy. I have reviewed  the indications for carotid endarterectomy, that is to lower the risk of future stroke. I have also reviewed the potential complications of surgery, including but not limited to: bleeding, stroke (perioperative risk 1-2%), MI, nerve injury of other unpredictable medical problems. All of the patients questions were answered and they are agreeable to proceed with surgery.  He will call to schedule the surgery once he talks to his family. He is on aspirin and is on a statin and knows to continue these.   Angelia Mould MD [facility] ([place])

## 2014-06-30 ENCOUNTER — Other Ambulatory Visit: Payer: Self-pay | Admitting: *Deleted

## 2014-07-10 ENCOUNTER — Encounter: Payer: Self-pay | Admitting: Cardiology

## 2014-07-16 ENCOUNTER — Encounter (HOSPITAL_COMMUNITY): Payer: Self-pay

## 2014-07-16 NOTE — Pre-Procedure Instructions (Signed)
Jason Henderson  07/16/2014   Your procedure is scheduled on:  Friday July 18, 2014 at 7:30 AM.  Report to [facility] Admitting at 5:30 AM.  Call this number if you have problems the morning of surgery: [phone]   Remember:   Do not eat food or drink liquids after midnight.   Take these medicines the morning of surgery with A SIP OF WATER: Bicalutamide (Casodex)   Please stop taking any vitamins   Do not wear jewelry.  Do not wear lotions, powders, or cologne.   Men may shave face.  Do not bring valuables to the hospital.  [facility] is not responsible for any belongings or valuables.               Contacts, dentures or bridgework may not be worn into surgery.  Leave suitcase in the car. After surgery it may be brought to your room.  For patients admitted to the hospital, discharge time is determined by your treatment team.               Patients discharged the day of surgery will not be allowed to drive home.  Name and phone number of your driver:   Special Instructions: Shower using CHG soap the night before and there morning of your surgery   Please read over the following fact sheets that you were given: Pain Booklet, Coughing and Deep Breathing, Blood Transfusion Information, MRSA Information and Surgical Site Infection Prevention

## 2014-07-17 ENCOUNTER — Encounter (HOSPITAL_COMMUNITY): Payer: Self-pay

## 2014-07-17 ENCOUNTER — Encounter (HOSPITAL_COMMUNITY)
Admission: RE | Admit: 2014-07-17 | Discharge: 2014-07-17 | Disposition: A | Discharge status: Home / Self Care | Payer: Medicare Other | Source: Ambulatory Visit | Attending: Vascular Surgery | Admitting: Vascular Surgery

## 2014-07-17 LAB — COMPREHENSIVE METABOLIC PANEL
ALK PHOS: 65 U/L (ref 39–117)
ALT: 21 U/L (ref 0–53)
AST: 24 U/L (ref 0–37)
Albumin: 3.9 g/dL (ref 3.5–5.2)
Anion gap: 8 (ref 5–15)
BUN: 27 mg/dL — ABNORMAL HIGH (ref 6–23)
CO2: 23 mmol/L (ref 19–32)
Calcium: 9.3 mg/dL (ref 8.4–10.5)
Chloride: 108 mmol/L (ref 96–112)
Creatinine, Ser: 1.34 mg/dL (ref 0.50–1.35)
GFR calc Af Amer: 59 mL/min — ABNORMAL LOW (ref >90)
GFR, EST NON AFRICAN AMERICAN: 51 mL/min — AB (ref >90)
GLUCOSE: 116 mg/dL — AB (ref 70–99)
POTASSIUM: 4.4 mmol/L (ref 3.5–5.1)
SODIUM: 139 mmol/L (ref 135–145)
TOTAL PROTEIN: 6.9 g/dL (ref 6.0–8.3)
Total Bilirubin: 1 mg/dL (ref 0.3–1.2)

## 2014-07-17 LAB — URINALYSIS, ROUTINE W REFLEX MICROSCOPIC
Bilirubin Urine: NEGATIVE
Glucose, UA: NEGATIVE mg/dL
KETONES UR: NEGATIVE mg/dL
LEUKOCYTES UA: NEGATIVE
Nitrite: NEGATIVE
PROTEIN: NEGATIVE mg/dL
Specific Gravity, Urine: 1.018 (ref 1.005–1.030)
Urobilinogen, UA: 0.2 mg/dL (ref 0.0–1.0)
pH: 5 (ref 5.0–8.0)

## 2014-07-17 LAB — CBC
HCT: 39.8 % (ref 39.0–52.0)
Hemoglobin: 13.2 g/dL (ref 13.0–17.0)
MCH: 30.7 pg (ref 26.0–34.0)
MCHC: 33.2 g/dL (ref 30.0–36.0)
MCV: 92.6 fL (ref 78.0–100.0)
Platelets: 216 10*3/uL (ref 150–400)
RBC: 4.3 MIL/uL (ref 4.22–5.81)
RDW: 13.5 % (ref 11.5–15.5)
WBC: 5.9 10*3/uL (ref 4.0–10.5)

## 2014-07-17 LAB — URINE MICROSCOPIC-ADD ON

## 2014-07-17 LAB — SURGICAL PCR SCREEN
MRSA, PCR: NEGATIVE
Staphylococcus aureus: NEGATIVE

## 2014-07-17 LAB — APTT: aPTT: 30 seconds (ref 24–37)

## 2014-07-17 LAB — TYPE AND SCREEN
ABO/RH(D): A POS
Antibody Screen: NEGATIVE

## 2014-07-17 LAB — PROTIME-INR
INR: 1.13 (ref 0.00–1.49)
Prothrombin Time: 14.6 seconds (ref 11.6–15.2)

## 2014-07-17 MED ORDER — DEXTROSE 5 % IV SOLN
1.5000 g | INTRAVENOUS | Status: AC
  Administered 2014-07-18: 1.5 g via INTRAVENOUS
  Filled 2014-07-17: qty 1.5

## 2014-07-17 MED ORDER — CHLORHEXIDINE GLUCONATE 4 % EX LIQD
60.0000 mL | Freq: Once | CUTANEOUS | Status: DC
  Filled 2014-07-17: qty 60

## 2014-07-17 MED ORDER — SODIUM CHLORIDE 0.9 % IV SOLN
INTRAVENOUS | Status: DC
Start: 2014-07-17 — End: 2014-07-18

## 2014-07-17 NOTE — Progress Notes (Signed)
PCP is Rachell Cipro and Cardiologist is [facility]. Pre-op clearance in EPIC on 06/26/14.   Patient denied having any acute cardiac or pulmonary issues.

## 2014-07-17 NOTE — Progress Notes (Signed)
Anesthesia Chart Review:  Pt is 74 year old male scheduled for R carotid endarterectomy on 07/18/2014 with Dr. Scot Dock.   PCP is Dr. Rachell Cipro and cardiologist is Dr. Minus Breeding.   PMH includes: HTN, AAA, hyperlipidemia, renal insufficiency, prostate cancer. S/p abdominal aortic endovascular stent 04/06/2012. Former smoker. BMI 34.   Medications include: ASA. Pt is to continue this during surgery.   Preoperative labs reviewed.    EKG: NSR. Left axis deviation.   Carotid duplex US 06/13/2014: -80-99% stenosis of R ICA. Vessel appears normal beyond the lesion which is isolated to the proximal 2cm of the ICA.  -less than 40% stenosis of the L ICA. Although velocities suggest higher, visible disease does not support a greater category and may be elevated due to compensatory flow.   Stress test 04/03/2012: Moderate risk study.  There is a medium, mild, primarily reversible basal inferolateral and basal to mid inferior perfusion defect. LV Ejection Fraction: 71%. LV Wall Motion: NL LV Function; NL Wall Motion. Comment from Dr. Percival Spanish 04/05/2012: "I have reviewed this very carefully with the patient and reviewed the results with Dr. Aundra Dubin. The patient has had no angina. This is an intermediate risk study only because the size of the defect was on the moderate side while the degree of reversibility was only mild. He can exercise greater than 4 METS on further discussion. The EF was 71%. Given all of this, according to ACC/AHA guidelines, there is no indication for benefit of revascularization so that cath is not indicated. 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 We talked at length about risk reduction and he is going to try to quit smoking today."  Pt has cardiac clearance for surgery from Dr. Percival Spanish in [place] note dated 06/23/2014.   If no changes, I anticipate pt can proceed with surgery as scheduled.   Willeen Cass,  FNP-BC [facility]/Anesthesiology Phone: [phone] 07/17/2014 12:00 PM

## 2014-07-18 ENCOUNTER — Encounter (HOSPITAL_COMMUNITY)
Admission: RE | Disposition: A | Discharge status: Home / Self Care | Payer: Self-pay | Source: Ambulatory Visit | Attending: Vascular Surgery

## 2014-07-18 ENCOUNTER — Telehealth: Payer: Self-pay | Admitting: Vascular Surgery

## 2014-07-18 ENCOUNTER — Inpatient Hospital Stay (HOSPITAL_COMMUNITY): Payer: Medicare Other | Admitting: Emergency Medicine

## 2014-07-18 ENCOUNTER — Encounter (HOSPITAL_COMMUNITY): Payer: Self-pay | Admitting: *Deleted

## 2014-07-18 ENCOUNTER — Inpatient Hospital Stay (HOSPITAL_COMMUNITY)
Admission: RE | Admit: 2014-07-18 | Discharge: 2014-07-19 | DRG: 039 | Disposition: A | Discharge status: Home / Self Care | Payer: Medicare Other | Source: Ambulatory Visit | Attending: Vascular Surgery | Admitting: Vascular Surgery

## 2014-07-18 ENCOUNTER — Inpatient Hospital Stay (HOSPITAL_COMMUNITY): Payer: Medicare Other | Admitting: Anesthesiology

## 2014-07-18 DIAGNOSIS — C61 Malignant neoplasm of prostate: Secondary | ICD-10-CM | POA: Diagnosis not present

## 2014-07-18 DIAGNOSIS — Z8546 Personal history of malignant neoplasm of prostate: Secondary | ICD-10-CM | POA: Diagnosis not present

## 2014-07-18 DIAGNOSIS — E785 Hyperlipidemia, unspecified: Secondary | ICD-10-CM | POA: Diagnosis present

## 2014-07-18 DIAGNOSIS — I771 Stricture of artery: Secondary | ICD-10-CM | POA: Diagnosis not present

## 2014-07-18 DIAGNOSIS — Z87891 Personal history of nicotine dependence: Secondary | ICD-10-CM | POA: Diagnosis not present

## 2014-07-18 DIAGNOSIS — I959 Hypotension, unspecified: Secondary | ICD-10-CM | POA: Diagnosis present

## 2014-07-18 DIAGNOSIS — N189 Chronic kidney disease, unspecified: Secondary | ICD-10-CM | POA: Diagnosis present

## 2014-07-18 DIAGNOSIS — I6523 Occlusion and stenosis of bilateral carotid arteries: Secondary | ICD-10-CM | POA: Diagnosis not present

## 2014-07-18 DIAGNOSIS — I129 Hypertensive chronic kidney disease with stage 1 through stage 4 chronic kidney disease, or unspecified chronic kidney disease: Secondary | ICD-10-CM | POA: Diagnosis present

## 2014-07-18 DIAGNOSIS — Z7982 Long term (current) use of aspirin: Secondary | ICD-10-CM | POA: Diagnosis not present

## 2014-07-18 DIAGNOSIS — I6529 Occlusion and stenosis of unspecified carotid artery: Secondary | ICD-10-CM | POA: Diagnosis present

## 2014-07-18 DIAGNOSIS — I6521 Occlusion and stenosis of right carotid artery: Secondary | ICD-10-CM | POA: Diagnosis not present

## 2014-07-18 HISTORY — PX: ENDARTERECTOMY: SHX5162

## 2014-07-18 HISTORY — PX: PATCH ANGIOPLASTY: SHX6230

## 2014-07-18 SURGERY — ENDARTERECTOMY, CAROTID
Anesthesia: General | Site: Neck | Laterality: Right

## 2014-07-18 MED ORDER — PHENYLEPHRINE HCL 10 MG/ML IJ SOLN
10.0000 mg | INTRAVENOUS | Status: DC | PRN
  Administered 2014-07-18: 10 ug/min via INTRAVENOUS

## 2014-07-18 MED ORDER — GLYCOPYRROLATE 0.2 MG/ML IJ SOLN
INTRAMUSCULAR | Status: AC
  Filled 2014-07-18: qty 4

## 2014-07-18 MED ORDER — OXYCODONE-ACETAMINOPHEN 5-325 MG PO TABS
1.0000 | ORAL_TABLET | ORAL | Status: DC | PRN
  Administered 2014-07-18: 2 via ORAL
  Filled 2014-07-18: qty 2

## 2014-07-18 MED ORDER — LIDOCAINE-EPINEPHRINE (PF) 1 %-1:200000 IJ SOLN
INTRAMUSCULAR | Status: AC
Start: 2014-07-18 — End: 2014-07-18
  Filled 2014-07-18: qty 10

## 2014-07-18 MED ORDER — PROPOFOL 10 MG/ML IV BOLUS
INTRAVENOUS | Status: DC | PRN
  Administered 2014-07-18: 10 mg via INTRAVENOUS

## 2014-07-18 MED ORDER — DOCUSATE SODIUM 100 MG PO CAPS
100.0000 mg | ORAL_CAPSULE | Freq: Every day | ORAL | Status: DC
  Administered 2014-07-19: 100 mg via ORAL
  Filled 2014-07-18: qty 1

## 2014-07-18 MED ORDER — DEXAMETHASONE SODIUM PHOSPHATE 4 MG/ML IJ SOLN
INTRAMUSCULAR | Status: DC | PRN
  Administered 2014-07-18: 4 mg via INTRAVENOUS

## 2014-07-18 MED ORDER — PROMETHAZINE HCL 25 MG/ML IJ SOLN: 6.2500 mg | INTRAMUSCULAR | Status: DC | PRN

## 2014-07-18 MED ORDER — PROTAMINE SULFATE 10 MG/ML IV SOLN
INTRAVENOUS | Status: DC | PRN
  Administered 2014-07-18: 40 mg via INTRAVENOUS

## 2014-07-18 MED ORDER — PANTOPRAZOLE SODIUM 40 MG PO TBEC
40.0000 mg | DELAYED_RELEASE_TABLET | Freq: Every day | ORAL | Status: DC
  Administered 2014-07-18: 40 mg via ORAL
  Filled 2014-07-18 (×2): qty 1

## 2014-07-18 MED ORDER — LIDOCAINE HCL (CARDIAC) 20 MG/ML IV SOLN
INTRAVENOUS | Status: DC | PRN
  Administered 2014-07-18: 50 mg via INTRAVENOUS

## 2014-07-18 MED ORDER — FENTANYL CITRATE 0.05 MG/ML IJ SOLN
INTRAMUSCULAR | Status: AC
  Filled 2014-07-18: qty 5

## 2014-07-18 MED ORDER — SODIUM CHLORIDE 0.9 % IV SOLN: INTRAVENOUS | Status: DC

## 2014-07-18 MED ORDER — ESMOLOL HCL 10 MG/ML IV SOLN
INTRAVENOUS | Status: DC | PRN
  Administered 2014-07-18: 30 mg via INTRAVENOUS

## 2014-07-18 MED ORDER — LISINOPRIL 10 MG PO TABS
10.0000 mg | ORAL_TABLET | Freq: Every day | ORAL | Status: DC
  Filled 2014-07-18: qty 1

## 2014-07-18 MED ORDER — HYDROMORPHONE HCL 1 MG/ML IJ SOLN: 0.2500 mg | INTRAMUSCULAR | Status: DC | PRN

## 2014-07-18 MED ORDER — ASPIRIN EC 325 MG PO TBEC
325.0000 mg | DELAYED_RELEASE_TABLET | Freq: Every day | ORAL | Status: DC
  Administered 2014-07-18: 325 mg via ORAL
  Filled 2014-07-18 (×2): qty 1

## 2014-07-18 MED ORDER — BICALUTAMIDE 50 MG PO TABS
50.0000 mg | ORAL_TABLET | Freq: Every day | ORAL | Status: DC
  Administered 2014-07-19: 50 mg via ORAL
  Filled 2014-07-18: qty 1

## 2014-07-18 MED ORDER — ROCURONIUM BROMIDE 100 MG/10ML IV SOLN
INTRAVENOUS | Status: DC | PRN
  Administered 2014-07-18: 50 mg via INTRAVENOUS

## 2014-07-18 MED ORDER — LIDOCAINE HCL (PF) 1 % IJ SOLN
INTRAMUSCULAR | Status: AC
Start: 2014-07-18 — End: 2014-07-18
  Filled 2014-07-18: qty 30

## 2014-07-18 MED ORDER — PROTAMINE SULFATE 10 MG/ML IV SOLN
INTRAVENOUS | Status: AC
  Filled 2014-07-18: qty 5

## 2014-07-18 MED ORDER — ENOXAPARIN SODIUM 30 MG/0.3ML ~~LOC~~ SOLN: 30.0000 mg | SUBCUTANEOUS | Status: DC

## 2014-07-18 MED ORDER — PHENOL 1.4 % MT LIQD
1.0000 | OROMUCOSAL | Status: DC | PRN
  Filled 2014-07-18: qty 177

## 2014-07-18 MED ORDER — ONDANSETRON HCL 4 MG/2ML IJ SOLN
INTRAMUSCULAR | Status: DC | PRN
  Administered 2014-07-18: 4 mg via INTRAVENOUS

## 2014-07-18 MED ORDER — NEOSTIGMINE METHYLSULFATE 10 MG/10ML IV SOLN
INTRAVENOUS | Status: DC | PRN
  Administered 2014-07-18: 4 mg via INTRAVENOUS

## 2014-07-18 MED ORDER — ROCURONIUM BROMIDE 50 MG/5ML IV SOLN
INTRAVENOUS | Status: AC
  Filled 2014-07-18: qty 1

## 2014-07-18 MED ORDER — OXYBUTYNIN CHLORIDE ER 10 MG PO TB24
10.0000 mg | ORAL_TABLET | Freq: Every day | ORAL | Status: DC
  Administered 2014-07-18: 10 mg via ORAL
  Filled 2014-07-18 (×2): qty 1

## 2014-07-18 MED ORDER — ENOXAPARIN SODIUM 30 MG/0.3ML ~~LOC~~ SOLN
30.0000 mg | SUBCUTANEOUS | Status: DC
  Administered 2014-07-19: 30 mg via SUBCUTANEOUS
  Filled 2014-07-18: qty 0.3

## 2014-07-18 MED ORDER — SODIUM CHLORIDE 0.9 % IJ SOLN
INTRAMUSCULAR | Status: AC
  Filled 2014-07-18: qty 20

## 2014-07-18 MED ORDER — ACETAMINOPHEN 325 MG RE SUPP
325.0000 mg | RECTAL | Status: DC | PRN
  Filled 2014-07-18: qty 2

## 2014-07-18 MED ORDER — CEFUROXIME SODIUM 1.5 G IJ SOLR
1.5000 g | Freq: Two times a day (BID) | INTRAMUSCULAR | Status: AC
  Administered 2014-07-18 – 2014-07-19 (×2): 1.5 g via INTRAVENOUS
  Filled 2014-07-18 (×2): qty 1.5

## 2014-07-18 MED ORDER — GLYCOPYRROLATE 0.2 MG/ML IJ SOLN
INTRAMUSCULAR | Status: DC | PRN
  Administered 2014-07-18: 0.6 mg via INTRAVENOUS
  Administered 2014-07-18 (×2): 0.2 mg via INTRAVENOUS

## 2014-07-18 MED ORDER — OXYCODONE HCL 5 MG/5ML PO SOLN: 5.0000 mg | Freq: Once | ORAL | Status: DC | PRN

## 2014-07-18 MED ORDER — MORPHINE SULFATE 2 MG/ML IJ SOLN
2.0000 mg | INTRAMUSCULAR | Status: DC | PRN
Start: 2014-07-18 — End: 2014-07-19
  Administered 2014-07-18: 2 mg via INTRAVENOUS

## 2014-07-18 MED ORDER — FENTANYL CITRATE 0.05 MG/ML IJ SOLN
INTRAMUSCULAR | Status: DC | PRN
  Administered 2014-07-18 (×5): 50 ug via INTRAVENOUS

## 2014-07-18 MED ORDER — HEPARIN SODIUM (PORCINE) 1000 UNIT/ML IJ SOLN
INTRAMUSCULAR | Status: DC | PRN
  Administered 2014-07-18: 9000 [IU] via INTRAVENOUS

## 2014-07-18 MED ORDER — STERILE WATER FOR INJECTION IJ SOLN
INTRAMUSCULAR | Status: AC
  Filled 2014-07-18: qty 10

## 2014-07-18 MED ORDER — GUAIFENESIN-DM 100-10 MG/5ML PO SYRP: 15.0000 mL | ORAL_SOLUTION | ORAL | Status: DC | PRN

## 2014-07-18 MED ORDER — HYDRALAZINE HCL 20 MG/ML IJ SOLN: 5.0000 mg | INTRAMUSCULAR | Status: DC | PRN

## 2014-07-18 MED ORDER — OXYCODONE-ACETAMINOPHEN 5-325 MG PO TABS: 1.0000 | ORAL_TABLET | Freq: Four times a day (QID) | ORAL | Status: DC | PRN

## 2014-07-18 MED ORDER — HEPARIN SODIUM (PORCINE) 1000 UNIT/ML IJ SOLN
INTRAMUSCULAR | Status: AC
  Filled 2014-07-18: qty 1

## 2014-07-18 MED ORDER — THROMBIN 20000 UNITS EX SOLR
CUTANEOUS | Status: AC
  Filled 2014-07-18: qty 20000

## 2014-07-18 MED ORDER — DEXTRAN 40 IN SALINE 10-0.9 % IV SOLN
INTRAVENOUS | Status: AC
  Filled 2014-07-18: qty 500

## 2014-07-18 MED ORDER — NEOSTIGMINE METHYLSULFATE 10 MG/10ML IV SOLN
INTRAVENOUS | Status: AC
  Filled 2014-07-18: qty 3

## 2014-07-18 MED ORDER — DOPAMINE-DEXTROSE 3.2-5 MG/ML-% IV SOLN
INTRAVENOUS | Status: AC
  Filled 2014-07-18: qty 250

## 2014-07-18 MED ORDER — METOPROLOL TARTRATE 1 MG/ML IV SOLN: 2.0000 mg | INTRAVENOUS | Status: DC | PRN

## 2014-07-18 MED ORDER — LIDOCAINE-EPINEPHRINE (PF) 1 %-1:200000 IJ SOLN
INTRAMUSCULAR | Status: DC | PRN
  Administered 2014-07-18: 10 mL

## 2014-07-18 MED ORDER — LIDOCAINE HCL (CARDIAC) 20 MG/ML IV SOLN
INTRAVENOUS | Status: AC
  Filled 2014-07-18: qty 5

## 2014-07-18 MED ORDER — PHENYLEPHRINE HCL 10 MG/ML IJ SOLN
INTRAMUSCULAR | Status: AC
  Filled 2014-07-18: qty 1

## 2014-07-18 MED ORDER — ESMOLOL HCL 10 MG/ML IV SOLN
INTRAVENOUS | Status: AC
  Filled 2014-07-18: qty 10

## 2014-07-18 MED ORDER — SODIUM CHLORIDE 0.9 % IV SOLN: 500.0000 mL | Freq: Once | INTRAVENOUS | Status: AC | PRN

## 2014-07-18 MED ORDER — ONDANSETRON HCL 4 MG/2ML IJ SOLN: 4.0000 mg | Freq: Four times a day (QID) | INTRAMUSCULAR | Status: DC | PRN

## 2014-07-18 MED ORDER — MORPHINE SULFATE 2 MG/ML IJ SOLN
INTRAMUSCULAR | Status: AC
  Administered 2014-07-18: 2 mg via INTRAVENOUS
  Filled 2014-07-18: qty 1

## 2014-07-18 MED ORDER — ACETAMINOPHEN 325 MG PO TABS
325.0000 mg | ORAL_TABLET | ORAL | Status: DC | PRN
  Filled 2014-07-18: qty 2

## 2014-07-18 MED ORDER — BISACODYL 10 MG RE SUPP: 10.0000 mg | Freq: Every day | RECTAL | Status: DC | PRN

## 2014-07-18 MED ORDER — SODIUM CHLORIDE 0.9 % IR SOLN
Status: DC | PRN
  Administered 2014-07-18: 500 mL

## 2014-07-18 MED ORDER — DEXAMETHASONE SODIUM PHOSPHATE 4 MG/ML IJ SOLN
INTRAMUSCULAR | Status: AC
  Filled 2014-07-18: qty 1

## 2014-07-18 MED ORDER — ONDANSETRON HCL 4 MG/2ML IJ SOLN
INTRAMUSCULAR | Status: AC
  Filled 2014-07-18: qty 2

## 2014-07-18 MED ORDER — DOPAMINE-DEXTROSE 3.2-5 MG/ML-% IV SOLN
2.5000 ug/kg/min | INTRAVENOUS | Status: DC | PRN
  Administered 2014-07-18: 2.5 ug/kg/min via INTRAVENOUS
  Filled 2014-07-18: qty 250

## 2014-07-18 MED ORDER — ALUM & MAG HYDROXIDE-SIMETH 200-200-20 MG/5ML PO SUSP: 15.0000 mL | ORAL | Status: DC | PRN

## 2014-07-18 MED ORDER — EPHEDRINE SULFATE 50 MG/ML IJ SOLN
INTRAMUSCULAR | Status: DC | PRN
  Administered 2014-07-18 (×5): 5 mg via INTRAVENOUS

## 2014-07-18 MED ORDER — LABETALOL HCL 5 MG/ML IV SOLN: 10.0000 mg | INTRAVENOUS | Status: DC | PRN

## 2014-07-18 MED ORDER — DEXTRAN 40 IN SALINE 10-0.9 % IV SOLN
INTRAVENOUS | Status: DC | PRN
  Administered 2014-07-18: 500 mL

## 2014-07-18 MED ORDER — POTASSIUM CHLORIDE CRYS ER 20 MEQ PO TBCR: 20.0000 meq | EXTENDED_RELEASE_TABLET | Freq: Every day | ORAL | Status: DC | PRN

## 2014-07-18 MED ORDER — DOXAZOSIN MESYLATE 1 MG PO TABS
1.0000 mg | ORAL_TABLET | Freq: Every day | ORAL | Status: DC
  Administered 2014-07-18: 1 mg via ORAL
  Filled 2014-07-18 (×2): qty 1

## 2014-07-18 MED ORDER — SUCCINYLCHOLINE CHLORIDE 20 MG/ML IJ SOLN
INTRAMUSCULAR | Status: AC
  Filled 2014-07-18: qty 1

## 2014-07-18 MED ORDER — EPHEDRINE SULFATE 50 MG/ML IJ SOLN
INTRAMUSCULAR | Status: AC
  Filled 2014-07-18: qty 1

## 2014-07-18 MED ORDER — ARTIFICIAL TEARS OP OINT
TOPICAL_OINTMENT | OPHTHALMIC | Status: AC
  Filled 2014-07-18: qty 3.5

## 2014-07-18 MED ORDER — OXYCODONE-ACETAMINOPHEN 5-325 MG PO TABS
ORAL_TABLET | ORAL | Status: AC
  Administered 2014-07-18: 2 via ORAL
  Filled 2014-07-18: qty 2

## 2014-07-18 MED ORDER — OXYCODONE HCL 5 MG PO TABS: 5.0000 mg | ORAL_TABLET | Freq: Once | ORAL | Status: DC | PRN

## 2014-07-18 MED ORDER — 0.9 % SODIUM CHLORIDE (POUR BTL) OPTIME
TOPICAL | Status: DC | PRN
  Administered 2014-07-18: 2000 mL

## 2014-07-18 MED ORDER — LACTATED RINGERS IV SOLN
INTRAVENOUS | Status: DC | PRN
  Administered 2014-07-18 (×2): via INTRAVENOUS

## 2014-07-18 MED ORDER — ATORVASTATIN CALCIUM 80 MG PO TABS
80.0000 mg | ORAL_TABLET | Freq: Every day | ORAL | Status: DC
  Administered 2014-07-18: 80 mg via ORAL
  Filled 2014-07-18 (×2): qty 1

## 2014-07-18 SURGICAL SUPPLY — 39 items
BAG DECANTER FOR FLEXI CONT (MISCELLANEOUS) ×3 IMPLANT
CANISTER SUCTION 2500CC (MISCELLANEOUS) ×3 IMPLANT
CANNULA VESSEL 3MM 2 BLNT TIP (CANNULA) ×6 IMPLANT
CATH ROBINSON RED A/P 18FR (CATHETERS) ×3 IMPLANT
CLIP TI MEDIUM 24 (CLIP) ×3 IMPLANT
CLIP TI WIDE RED SMALL 24 (CLIP) ×3 IMPLANT
COVER TRANSDUCER ULTRASND GEL (DRAPE) ×3 IMPLANT
CRADLE DONUT ADULT HEAD (MISCELLANEOUS) ×3 IMPLANT
DRAIN CHANNEL 15F RND FF W/TCR (WOUND CARE) IMPLANT
ELECT REM PT RETURN 9FT ADLT (ELECTROSURGICAL) ×3
ELECTRODE REM PT RTRN 9FT ADLT (ELECTROSURGICAL) ×1 IMPLANT
EVACUATOR SILICONE 100CC (DRAIN) IMPLANT
GLOVE BIO SURGEON STRL SZ7.5 (GLOVE) ×3 IMPLANT
GLOVE BIOGEL PI IND STRL 8 (GLOVE) ×1 IMPLANT
GLOVE BIOGEL PI INDICATOR 8 (GLOVE) ×2
GOWN STRL REUS W/ TWL LRG LVL3 (GOWN DISPOSABLE) ×3 IMPLANT
GOWN STRL REUS W/TWL LRG LVL3 (GOWN DISPOSABLE) ×6
KIT BASIN OR (CUSTOM PROCEDURE TRAY) ×3 IMPLANT
KIT ROOM TURNOVER OR (KITS) ×3 IMPLANT
LIQUID BAND (GAUZE/BANDAGES/DRESSINGS) ×3 IMPLANT
NEEDLE HYPO 25X1 1.5 SAFETY (NEEDLE) ×3 IMPLANT
NS IRRIG 1000ML POUR BTL (IV SOLUTION) ×6 IMPLANT
PACK CAROTID (CUSTOM PROCEDURE TRAY) ×3 IMPLANT
PAD ARMBOARD 7.5X6 YLW CONV (MISCELLANEOUS) ×6 IMPLANT
PATCH VASCULAR VASCU GUARD 1X6 (Vascular Products) ×3 IMPLANT
PROBE PENCIL 8 MHZ STRL DISP (MISCELLANEOUS) IMPLANT
SHUNT CAROTID BYPASS 10 (VASCULAR PRODUCTS) IMPLANT
SHUNT CAROTID BYPASS 12 (VASCULAR PRODUCTS) ×3 IMPLANT
SHUNT CAROTID BYPASS 12FRX15.5 (VASCULAR PRODUCTS) IMPLANT
SPONGE INTESTINAL PEANUT (DISPOSABLE) ×3 IMPLANT
SPONGE SURGIFOAM ABS GEL 100 (HEMOSTASIS) IMPLANT
SUT PROLENE 6 0 BV (SUTURE) ×3 IMPLANT
SUT PROLENE 7 0 BV 1 (SUTURE) IMPLANT
SUT SILK 2 0 FS (SUTURE) IMPLANT
SUT VIC AB 3-0 SH 27 (SUTURE) ×2
SUT VIC AB 3-0 SH 27X BRD (SUTURE) ×1 IMPLANT
SUT VICRYL 4-0 PS2 18IN ABS (SUTURE) ×3 IMPLANT
SYR CONTROL 10ML LL (SYRINGE) ×3 IMPLANT
WATER STERILE IRR 1000ML POUR (IV SOLUTION) ×3 IMPLANT

## 2014-07-18 NOTE — Transfer of Care (Signed)
Immediate Anesthesia Transfer of Care Note  Patient: Jason Henderson  Procedure(s) Performed: Procedure(s): ENDARTERECTOMY CAROTID (Right) PATCH ANGIOPLASTY USING 1cm X 6cm Bovine Pericardial Patch (Right)  Patient Location: PACU  Anesthesia Type:General  Level of Consciousness: awake  Airway & Oxygen Therapy: Patient Spontanous Breathing and Patient connected to face mask oxygen  Post-op Assessment: Report given to RN and Post -op Vital signs reviewed and stable  Post vital signs: Reviewed and stable  Last Vitals:  Filed Vitals:   07/18/14 0955  BP:   Pulse:   Temp: 37.1 C  Resp:     Complications: No apparent anesthesia complications

## 2014-07-18 NOTE — Anesthesia Postprocedure Evaluation (Signed)
Anesthesia Post Note  Patient: Jason Henderson  Procedure(s) Performed: Procedure(s) (LRB): ENDARTERECTOMY CAROTID (Right) PATCH ANGIOPLASTY USING 1cm X 6cm Bovine Pericardial Patch (Right)  Anesthesia type: General  Patient location: PACU  Post pain: Pain level controlled and Adequate analgesia  Post assessment: Post-op Vital signs reviewed, Patient's Cardiovascular Status Stable, Respiratory Function Stable, Patent Airway and Pain level controlled  Last Vitals:  Filed Vitals:   07/18/14 1030  BP: 75/30  Pulse: 46  Temp:   Resp: 11    Post vital signs: Reviewed and stable  Level of consciousness: awake, alert  and oriented  Complications: No apparent anesthesia complications

## 2014-07-18 NOTE — Progress Notes (Signed)
UR COMPLETED  

## 2014-07-18 NOTE — Interval H&P Note (Signed)
History and Physical Interval Note:  07/18/2014 7:21 AM  Jason Henderson  has presented today for surgery, with the diagnosis of Right carotid stenosis  The various methods of treatment have been discussed with the patient and family. After consideration of risks, benefits and other options for treatment, the patient has consented to  Procedure(s): ENDARTERECTOMY CAROTID (Right) as a surgical intervention .  The patient's history has been reviewed, patient examined, no change in status, stable for surgery.  I have reviewed the patient's chart and labs.  Questions were answered to the patient's satisfaction.     DICKSON,CHRISTOPHER S

## 2014-07-18 NOTE — Anesthesia Preprocedure Evaluation (Addendum)
Anesthesia Evaluation  Patient identified by MRN, date of birth, ID band Patient awake    Reviewed: Allergy & Precautions, NPO status , Patient's Chart, lab work & pertinent test results  History of Anesthesia Complications Negative for: history of anesthetic complications  Airway Mallampati: I  TM Distance: >3 FB Neck ROM: Full    Dental  (+) Teeth Intact, Dental Advisory Given Pt reports right front tooth loose preoperatively:   Pulmonary neg pulmonary ROS, former smoker,          Cardiovascular hypertension, + Peripheral Vascular Disease     Neuro/Psych negative neurological ROS  negative psych ROS   GI/Hepatic negative GI ROS, Neg liver ROS,   Endo/Other  negative endocrine ROS  Renal/GU Renal InsufficiencyRenal disease     Musculoskeletal   Abdominal   Peds  Hematology negative hematology ROS (+)   Anesthesia Other Findings   Reproductive/Obstetrics                            Anesthesia Physical Anesthesia Plan  ASA: III  Anesthesia Plan: General   Post-op Pain Management:    Induction: Intravenous  Airway Management Planned: Oral ETT  Additional Equipment: Arterial line  Intra-op Plan:   Post-operative Plan: Extubation in OR  Informed Consent:   Dental advisory given  Plan Discussed with: CRNA, Anesthesiologist and Surgeon  Anesthesia Plan Comments:        Anesthesia Quick Evaluation

## 2014-07-18 NOTE — Progress Notes (Signed)
   VASCULAR SURGERY POSTOP NOTE:  * On dopamine for BP, but feels fine and neuro intact.  * He is on ASA and a statin   SUBJECTIVE: No complaints.  PHYSICAL EXAM: Filed Vitals:   07/18/14 1100 07/18/14 1115 07/18/14 1130 07/18/14 1200  BP: 122/47 141/49 106/41 98/42  Pulse: 54 63 56 47  Temp:      TempSrc:      Resp: 15 14 14 17   SpO2: 96% 93% 94% 95%   Incision looks fine Neuro intact  Active Problems:   Carotid stenosis, asymptomatic  Gae Gallop Beeper: [phone] 07/18/2014

## 2014-07-18 NOTE — Progress Notes (Signed)
BP low on arrival to PACU. Dr. Scot Dock @ bedside. 500 cc NS bolus X 2 given as ordered.                                             Pt is A & O X3. Moving all ext's. Neuro intact. Pain free. BP remains low. P.A. @ bedside. Contacted Dr. Scot Dock. BP WNL after starting dopamine. Will continue to monitor & reassess

## 2014-07-18 NOTE — Op Note (Signed)
    NAME: NHAN QUALLEY   MRN: [id number] DOB: April 08, 1941    DATE OF OPERATION: 07/18/2014  PREOP DIAGNOSIS: greater than 80% asymptomatic right carotid stenosis  POSTOP DIAGNOSIS: same  PROCEDURE: right carotid endarterectomy with bovine pericardial patch angioplasty  SURGEON: Judeth Cornfield. Scot Dock, MD, FACS  ASSIST: Silva Bandy, [facility]  ANESTHESIA: Gen.   EBL: minimal  INDICATIONS: CORION SHERROD is a 74 y.o. male who I was following with a moderate right carotid stenosis. This progressed to greater than 80%. Right carotid endarterectomy was recommended in order to lower his risk of future stroke.  FINDINGS: The plaque extended fairly high up the internal carotid artery but tapered nicely.  TECHNIQUE: The patient was taken to the operating room and received a general anesthetic. An arterial line had been placed by anesthesia. The right neck was prepped and draped in the usual sterile fashion. An incision was made along the anterior border of the sternocleidomastoid. The dissection was carried down to the common carotid artery which was dissected free and controlled with Rummel tourniquet. The facial vein was divided between 2-0 silk ties. The internal carotid artery was controlled above the plaque. Of note the plaque extended fairly high up the internal carotid artery and the dissection was quite deep. The external carotid artery was controlled and the superior thyroid arteries were controlled. The patient was heparinized. Clamps were then placed on the internal, then the common carotid, then the external carotid artery. A longitudinal arteriotomy was made in the common carotid artery. This was extended through the plaque into the internal carotid artery above the plaque. A 12 shunt was placed into the internal carotid artery, backbled and then placed into the common carotid artery and secured with Rummel tourniquet. Flow was reestablished to the shunt. An endarterectomy plane was established  proximally and the plaque was sharply divided. Eversion endarterectomy was performed of the external carotid artery. Distally there was a nice taper and the plaque and no tacking sutures were required. The artery was irrigated with copious amounts of heparin and dextran all loose debris removed. A bovine pericardial patch had been soaked in saline and then cut the appropriate size. This was sewn as a patch using continuous 6-0 Prolene suture. Prior to completing the patch closure, the shunt was removed. The arteries were backbled and flushed appropriately in the anastomosis completed. Flow was reestablished first to the external carotid artery and into the internal carotid artery. At the completion was a good Doppler signal distal to the patch with good diastolic flow. The heparin was partially reversed with protamine. The wound was irrigated and hemostasis obtained. The wound was closed with the [place] Vicryl. The platysma was closed with running 3-0 Vicryl. The skin was closed with a 4-0 subcuticular stitch. Dermabond was applied. The patient will neurologically intact. All needle and sponge counts were correct. The patient was transferred to the recovery room in stable condition.  Deitra Mayo, MD, FACS [facility]  DATE OF DICTATION:   07/18/2014

## 2014-07-18 NOTE — Anesthesia Procedure Notes (Signed)
Procedure Name: Intubation Date/Time: 07/18/2014 8:05 AM Performed by: Maude Leriche D Pre-anesthesia Checklist: Patient identified, Emergency Drugs available, Suction available, Patient being monitored and Timeout performed Patient Re-evaluated:Patient Re-evaluated prior to inductionOxygen Delivery Method: Circle system utilized Preoxygenation: Pre-oxygenation with 100% oxygen Intubation Type: IV induction Ventilation: Mask ventilation without difficulty Laryngoscope Size: Miller and 2 Grade View: Grade I Tube type: Oral Tube size: 7.5 mm Number of attempts: 1 Airway Equipment and Method: Stylet

## 2014-07-18 NOTE — Progress Notes (Signed)
Pt sitting up in bed. Clear liquid tray ordered. Pt tolerated well.

## 2014-07-18 NOTE — Progress Notes (Signed)
    Patient with episode of hypotension asymptomatic.  S/p fluid bolus 500 cc times 3.  He is now on low dose Dopamine. BP stable 104/49.  Patient alert and oriented times 3.  No tongue deviation smile is symmetric.  S/P right carotid endarterectomy with bovine pericardial patch angioplasty Graceanna Theissen MAUREEN PA-C

## 2014-07-18 NOTE — H&P (View-Only) (Signed)
Patient ID: Jason Henderson, male   DOB: [birth year], 74 y.o.   MRN: [id number]  Reason for Consult: greater than 80% right carotid stenosis   Primary care physician: Rachell Cipro, MD  Subjective:     HPI:  Jason Henderson is a 74 y.o. 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 He was seen in our office on 06/13/2014 for a routine follow up visit. His aneurysm was continuing to shrink in size and he was doing well from that standpoint. He also had some moderate right carotid stenosis and a follow up duplex showed that the stenosis on the right had progressed significantly. He now has a greater than 80% right carotid stenosis with a less than 50% left carotid stenosis.  He is right-handed. He denies any history of stroke, TIAs, expressive or receptive aphasia, or amaurosis fugax.  He is not a smoker. He is on aspirin. He is on a statin.  He has been seen by Dr. Marijo File and the patient tells me that he has been cleared for surgery.  Past Medical History  Diagnosis Date  . Hypertension   . Abdominal aneurysm without mention of rupture   . Hyperlipidemia   . Prostate cancer   . Chronic kidney disease     renal insufficiency R side, stent placement   Family History  Problem Relation Age of Onset  . Cancer Mother     Lung  . Cancer Father     Colon   Past Surgical History  Procedure Laterality Date  . Transurethral resection of prostate    . Tonsillectomy and adenoidectomy    . Abdominal aortic endovascular stent graft  04/06/2012    Procedure: ABDOMINAL AORTIC ENDOVASCULAR STENT GRAFT;  Surgeon: Angelia Mould, MD;  Location: [facility];  Service: Vascular;  Laterality: N/A;  GORE; ultrasound guided.  . Abdominal aortagram N/A 04/02/2012    Procedure: ABDOMINAL Maxcine Ham;  Surgeon: Angelia Mould, MD;  Location: [facility] CATH LAB;  Service: Cardiovascular;  Laterality:  N/A;  . Prostate surgery      Short Social History:  History  Substance Use Topics  . Smoking status: Former Smoker    Types: Cigarettes    Quit date: 06/13/2014  . Smokeless tobacco: Never Used     Comment: He has used patches x 2 weeks  . Alcohol Use: No    No Known Allergies  Current Outpatient Prescriptions  Medication Sig Dispense Refill  . aspirin 81 MG EC tablet Take 81 mg by mouth daily.      Marland Kitchen atorvastatin (LIPITOR) 80 MG tablet Take 80 mg by mouth daily.    . bicalutamide (CASODEX) 50 MG tablet Take 50 mg by mouth daily.     . Calcium Carbonate-Vitamin D (CALTRATE 600+D PO) Take 1 tablet by mouth daily.    . Cholecalciferol (VITAMIN D3) 2000 UNITS TABS Take 2,000 Int'l Units by mouth. Alternates 1 tablet one day, then 2 tablets the next day    . doxazosin (CARDURA) 2 MG tablet Take 1 mg by mouth daily.     Marland Kitchen lisinopril (PRINIVIL,ZESTRIL) 20 MG tablet Take 10 mg by mouth daily.     Marland Kitchen oxybutynin (DITROPAN-XL) 10 MG 24 hr tablet Take 10 mg by mouth at bedtime.     No current facility-administered medications for this visit.    Review of Systems  Constitutional: Negative for chills and fever.  Eyes: Negative for loss of  vision.  Respiratory: Negative for cough and wheezing.  Cardiovascular: Negative for chest pain, chest tightness, claudication, dyspnea with exertion, orthopnea and palpitations.  GI: Negative for blood in stool and vomiting.  GU: Negative for dysuria and hematuria.  Musculoskeletal: Negative for leg pain, joint pain and myalgias.  Skin: Negative for rash and wound.  Neurological: Negative for dizziness and speech difficulty.  Hematologic: Negative for bruises/bleeds easily. Psychiatric: Negative for depressed mood.        Objective:  Objective  Filed Vitals:   06/26/14 1242 06/26/14 1245  BP: 97/56 101/57  Pulse: 54 59  Temp: 98.1 F (36.7 C)   TempSrc: Oral   Resp: 14   Height: 5\' 6"  (1.676 m)   Weight: 211 lb 9.6 oz (95.981 kg)     SpO2: 97%    Body mass index is 34.17 kg/(m^2).  Physical Exam  Constitutional: He is oriented to person, place, and time. He appears well-developed and well-nourished.  HENT:  Head: Normocephalic and atraumatic.  Neck: Neck supple. No JVD present. No thyromegaly present.  Cardiovascular: Normal rate, regular rhythm, normal heart sounds and normal pulses.  Exam reveals no friction rub.   No murmur heard. Pulses:      Femoral pulses are 2+ on the right side, and 2+ on the left side. I do not protect carotid bruits.  Pulmonary/Chest: Breath sounds normal. He has no wheezes. He has no rales.  Abdominal: Soft. Bowel sounds are normal. There is no tenderness.  Musculoskeletal: Normal range of motion. He exhibits no edema.  Lymphadenopathy:    He has no cervical adenopathy.  Neurological: He is alert and oriented to person, place, and time. He has normal strength. No sensory deficit.  Skin: No lesion and no rash noted.  Psychiatric: He has a normal mood and affect.    Data: I have reviewed his carotid duplex scan which was performed on 06/13/2014. This shows a greater than 80% right internal carotid artery stenosis. End-diastolic velocity on the right is 127 cm/s. There is a less than 40% left internal carotid artery stenosis. The stenosis appears to be in the proximal internal carotid artery. The bifurcation is at the mid hyoid. There does not appear to be significant stenosis beyond the proximal internal carotid artery.  I have reviewed his ultrasound of his abdominal aorta which was performed on 06/13/2014. This shows that the maximum diameter of his aorta is 4.35 cm which has decreased in size compared to 4.46 cm in February 2015. The aneurysm was 5.7 cm when it was repaired in November 2013.      Assessment/Plan:     GREATER THAN 80% ASYMPTOMATIC RIGHT CAROTID STENOSIS: Given the severity of the right carotid stenosis, I have recommended right carotid endarterectomy. I have reviewed  the indications for carotid endarterectomy, that is to lower the risk of future stroke. I have also reviewed the potential complications of surgery, including but not limited to: bleeding, stroke (perioperative risk 1-2%), MI, nerve injury of other unpredictable medical problems. All of the patients questions were answered and they are agreeable to proceed with surgery.  He will call to schedule the surgery once he talks to his family. He is on aspirin and is on a statin and knows to continue these.   Angelia Mould MD [facility]

## 2014-07-18 NOTE — Telephone Encounter (Addendum)
-----   Message from Mena Goes, RN sent at 07/18/2014 10:29 AM EST ----- Regarding: Schedule   ----- Message -----    From: Angelia Mould, MD    Sent: 07/18/2014  10:06 AM      To: Vvs Charge Pool Subject: chargeand f/u                                  PROCEDURE: right carotid endarterectomy with bovine pericardial patch angioplasty  SURGEON: Judeth Cornfield. Scot Dock, MD, FACS  ASSIST: Silva Bandy, [facility]  He needs a follow up visit on March 16 so that he can work on travel plans after that.  CD   07/18/14: voicemail is full, mailed letter, dpm

## 2014-07-19 LAB — CBC
HCT: 34.9 % — ABNORMAL LOW (ref 39.0–52.0)
Hemoglobin: 11.7 g/dL — ABNORMAL LOW (ref 13.0–17.0)
MCH: 30.8 pg (ref 26.0–34.0)
MCHC: 33.5 g/dL (ref 30.0–36.0)
MCV: 91.8 fL (ref 78.0–100.0)
PLATELETS: 196 10*3/uL (ref 150–400)
RBC: 3.8 MIL/uL — AB (ref 4.22–5.81)
RDW: 13.5 % (ref 11.5–15.5)
WBC: 10 10*3/uL (ref 4.0–10.5)

## 2014-07-19 LAB — BASIC METABOLIC PANEL
Anion gap: 7 (ref 5–15)
BUN: 28 mg/dL — AB (ref 6–23)
CALCIUM: 9 mg/dL (ref 8.4–10.5)
CO2: 22 mmol/L (ref 19–32)
Chloride: 108 mmol/L (ref 96–112)
Creatinine, Ser: 1.37 mg/dL — ABNORMAL HIGH (ref 0.50–1.35)
GFR, EST AFRICAN AMERICAN: 58 mL/min — AB (ref >90)
GFR, EST NON AFRICAN AMERICAN: 50 mL/min — AB (ref >90)
Glucose, Bld: 138 mg/dL — ABNORMAL HIGH (ref 70–99)
Potassium: 4.6 mmol/L (ref 3.5–5.1)
Sodium: 137 mmol/L (ref 135–145)

## 2014-07-19 NOTE — Progress Notes (Signed)
    Subjective  - POD #1, status post right carotid endarterectomy  No complaints this morning.  Has been able to void spontaneously.  Has ambulated in the halls.  Tolerating diet. Complaining of a sore throat. Continuing to require dopamine for decreased heart rate and blood pressure   Physical Exam:  incision is healing nicely. He is neurologically intact        Assessment/Plan:  POD #1  Primary remains in the 50s and blood pressure with a systolic around 244.  He is on dopamine.  We will attempt to saline lock his IV and discontinue his dopamine to see how his heart rate and blood pressure responded.  I'm also holding his lisinopril.  If he remains stable he will be okay for discharge otherwise, he may require an additional night stay to monitor his heart rate and blood pressure.  [place] 07/19/2014 10:48 AM --  Filed Vitals:   07/19/14 1047  BP: 108/63  Pulse:   Temp:   Resp: 18    Intake/Output Summary (Last 24 hours) at 07/19/14 1048 Last data filed at 07/19/14 0622  Gross per 24 hour  Intake 1087.27 ml  Output   1950 ml  Net -862.73 ml     Laboratory CBC    Component Value Date/Time   WBC 10.0 07/19/2014 0307   WBC 9.0 11/05/2013 1017   HGB 11.7* 07/19/2014 0307   HGB 14.1 11/05/2013 1017   HCT 34.9* 07/19/2014 0307   HCT 43.2 11/05/2013 1017   PLT 196 07/19/2014 0307   PLT 251 11/05/2013 1017    BMET    Component Value Date/Time   NA 137 07/19/2014 0307   NA 140 11/05/2013 1018   K 4.6 07/19/2014 0307   K 4.5 11/05/2013 1018   CL 108 07/19/2014 0307   CL 105 03/23/2012 1140   CO2 22 07/19/2014 0307   CO2 26 11/05/2013 1018   GLUCOSE 138* 07/19/2014 0307   GLUCOSE 95 11/05/2013 1018   GLUCOSE 81 03/23/2012 1140   BUN 28* 07/19/2014 0307   BUN 20.0 11/05/2013 1018   CREATININE 1.37* 07/19/2014 0307   CREATININE 1.3 11/05/2013 1018   CREATININE 1.92* 12/10/2012 1353   CALCIUM 9.0 07/19/2014 0307   CALCIUM 10.3 11/05/2013  1018   GFRNONAA 50* 07/19/2014 0307   GFRAA 58* 07/19/2014 0307    COAG Lab Results  Component Value Date   INR 1.13 07/17/2014   INR 1.30 04/06/2012   INR 1.17 04/02/2012   No results found for: PTT  Antibiotics Anti-infectives    Start     Dose/Rate Route Frequency Ordered Stop   07/18/14 1800  cefUROXime (ZINACEF) 1.5 g in dextrose 5 % 50 mL IVPB     1.5 g 100 mL/hr over 30 Minutes Intravenous Every 12 hours 07/18/14 1709 07/19/14 0652   07/17/14 1359  cefUROXime (ZINACEF) 1.5 g in dextrose 5 % 50 mL IVPB     1.5 g 100 mL/hr over 30 Minutes Intravenous 30 min pre-op 07/17/14 1359 07/18/14 0750       V. Leia Alf, M.D. [facility] [place] Office: [phone] Pager:  [phone]

## 2014-07-19 NOTE — Progress Notes (Signed)
Pt maintaining Bp 102-106 sys.  Pt to d/c home; reviewed paperwork, Pt and family understood.

## 2014-07-21 ENCOUNTER — Encounter (HOSPITAL_COMMUNITY): Payer: Self-pay | Admitting: Vascular Surgery

## 2014-07-23 NOTE — Discharge Summary (Signed)
[facility] Discharge Summary   Patient ID:  Jason Henderson MRN: [id number] DOB/AGE: [birth year] 73 y.o.  Admit date: 07/18/2014 Discharge date: 07/23/2014 Date of Surgery: 07/18/2014 Surgeon: Surgeon(s): Angelia Mould, MD  Admission Diagnosis: Right carotid stenosis  Discharge Diagnoses:  Right carotid stenosis  Secondary Diagnoses: Past Medical History  Diagnosis Date  . Hypertension   . Abdominal aneurysm without mention of rupture   . Hyperlipidemia   . Prostate cancer   . Chronic kidney disease     renal insufficiency R side, stent placement    Procedure(s): ENDARTERECTOMY CAROTID PATCH ANGIOPLASTY USING 1cm X 6cm Bovine Pericardial Patch  Discharged Condition: good  HPI: Jason Henderson is a 74 y.o. Male He was seen in our office on 06/13/2014 for a routine follow up visit. His aneurysm was continuing to shrink in size and he was doing well from that standpoint. He also had some moderate right carotid stenosis and a follow up duplex showed that the stenosis on the right had progressed significantly. He now has a greater than 80% right carotid stenosis with a less than 50% left carotid stenosis.  He is right-handed. He denies any history of stroke, TIAs, expressive or receptive aphasia, or amaurosis fugax.    Hospital Course:  Jason Henderson is a 74 y.o. male is S/P right Procedure(s): ENDARTERECTOMY CAROTID PATCH ANGIOPLASTY USING 1cm X 6cm Bovine Pericardial Patch In PACU post op Patient with episode of hypotension asymptomatic. S/p fluid bolus 500 cc times 3. He is now on low dose Dopamine. BP stable 104/49. Patient alert and oriented times 3. No tongue deviation smile is symmetric.   POD#1 Pt maintaining Bp 102-106 sys. After D/C'd dopamine and IV fluids.  He was discharged home in stable condition.      Significant Diagnostic Studies: CBC Lab Results  Component Value Date   WBC 10.0 07/19/2014   HGB 11.7* 07/19/2014    HCT 34.9* 07/19/2014   MCV 91.8 07/19/2014   PLT 196 07/19/2014    BMET    Component Value Date/Time   NA 137 07/19/2014 0307   NA 140 11/05/2013 1018   K 4.6 07/19/2014 0307   K 4.5 11/05/2013 1018   CL 108 07/19/2014 0307   CL 105 03/23/2012 1140   CO2 22 07/19/2014 0307   CO2 26 11/05/2013 1018   GLUCOSE 138* 07/19/2014 0307   GLUCOSE 95 11/05/2013 1018   GLUCOSE 81 03/23/2012 1140   BUN 28* 07/19/2014 0307   BUN 20.0 11/05/2013 1018   CREATININE 1.37* 07/19/2014 0307   CREATININE 1.3 11/05/2013 1018   CREATININE 1.92* 12/10/2012 1353   CALCIUM 9.0 07/19/2014 0307   CALCIUM 10.3 11/05/2013 1018   GFRNONAA 50* 07/19/2014 0307   GFRAA 58* 07/19/2014 0307   COAG Lab Results  Component Value Date   INR 1.13 07/17/2014   INR 1.30 04/06/2012   INR 1.17 04/02/2012     Disposition:  Discharge to :Home Discharge Instructions    CAROTID Sug[place]: Call MD for difficulty swallowing or speaking; weakness in arms or legs that is a new symtom; severe headache.  If you have increased swelling in the neck and/or  are having difficulty breathing, CALL [phone]    Complete by:  As directed      Call MD for:  redness, tenderness, or signs of infection (pain, swelling, bleeding, redness, odor or green/yellow discharge around incision site)    Complete by:  As directed      Call MD  for:  severe or increased pain, loss or decreased feeling  in affected limb(s)    Complete by:  As directed      Call MD for:  temperature >100.5    Complete by:  As directed      Discharge wound care:    Complete by:  As directed   May shower on 07/20/14. Then wash wound daily with soap and water and pat dry. There is glue over your incision. Do not pick the glue off. It will come off on its own.     Driving Restrictions    Complete by:  As directed   No driving for 2 weeks     Increase activity slowly    Complete by:  As directed   Walk with assistance use walker or cane as needed     Lifting restrictions     Complete by:  As directed   No lifting for 2 weeks     Resume previous diet    Complete by:  As directed             Medication List    TAKE these medications        aspirin 81 MG EC tablet  Take 81 mg by mouth daily.     atorvastatin 80 MG tablet  Commonly known as:  LIPITOR  Take 80 mg by mouth daily.     bicalutamide 50 MG tablet  Commonly known as:  CASODEX  Take 50 mg by mouth daily.     CALTRATE 600+D PO  Take 1 tablet by mouth daily.     doxazosin 2 MG tablet  Commonly known as:  CARDURA  Take 1 mg by mouth daily.     lisinopril 20 MG tablet  Commonly known as:  PRINIVIL,ZESTRIL  Take 10 mg by mouth daily.     LUPRON IJ  Inject as directed. Takes 1 injection every 4 months     oxybutynin 10 MG 24 hr tablet  Commonly known as:  DITROPAN-XL  Take 10 mg by mouth at bedtime.     oxyCODONE-acetaminophen 5-325 MG per tablet  Commonly known as:  ROXICET  Take 1 tablet by mouth every 6 (six) hours as needed for severe pain.     Vitamin D3 2000 UNITS Tabs  Take 2,000 Int'l Units by mouth. Alternates 1 tablet one day, then 2 tablets the next day       Verbal and written Discharge instructions given to the patient. Wound care per Discharge AVS     Follow-up Information    Follow up with Khylei Wilms S, MD In 2 weeks.   Specialty:  Vascular Surgery   Why:  Our office will call you to arrange an appointment (sent)   Contact information:   [address] [place] [address] [phone]       Signed: Laurence Slate [facility] 07/23/2014, 12:24 PM --- For VQI Registry use --- Instructions: Press F2 to tab through selections.  Delete question if not applicable.   Modified Rankin score at D/C (0-6): Rankin Score=0  IV medication needed for:  1. Hypertension: No 2. Hypotension: Yes  Post-op Complications: No  1. Post-op CVA or TIA: No  If yes: Event classification (right eye, left eye, right cortical, left cortical, verterobasilar, other):    If yes: Timing of event (intra-op, <6 hrs post-op, >=6 hrs post-op, unknown):   2. CN injury: No  If yes: CN  injuried   3. Myocardial infarction: No  If yes: Dx  by (EKG or clinical, Troponin):   4.  CHF: No  5.  Dysrhythmia (new): No  6. Wound infection: No  7. Reperfusion symptoms: No  8. Return to OR: No  If yes: return to OR for (bleeding, neurologic, other CEA incision, other):   Discharge medications: Statin use:  Yes ASA use:  Yes Beta blocker use:  No  for medical reason   ACE-Inhibitor use:  Yes P2Y12 Antagonist use: [x ] None, [ ]  Plavix, [ ]  Plasugrel, [ ]  Ticlopinine, [ ]  Ticagrelor, [ ]  Other, [ ]  No for medical reason, [ ]  Non-compliant, [ ]  Not-indicated Anti-coagulant use:  [x ] None, [ ]  Warfarin, [ ]  Rivaroxaban, [ ]  Dabigatran, [ ]  Other, [ ]  No for medical reason, [ ]  Non-compliant, [ ]  Not-indicated  Agree with above.  Deitra Mayo, MD, [place] [phone] Office: [phone]

## 2014-07-25 DIAGNOSIS — E782 Mixed hyperlipidemia: Secondary | ICD-10-CM | POA: Diagnosis not present

## 2014-07-25 DIAGNOSIS — I10 Essential (primary) hypertension: Secondary | ICD-10-CM | POA: Diagnosis not present

## 2014-07-25 DIAGNOSIS — E119 Type 2 diabetes mellitus without complications: Secondary | ICD-10-CM | POA: Diagnosis not present

## 2014-07-29 ENCOUNTER — Encounter: Payer: Self-pay | Admitting: Vascular Surgery

## 2014-07-30 ENCOUNTER — Ambulatory Visit (INDEPENDENT_AMBULATORY_CARE_PROVIDER_SITE_OTHER): Payer: Self-pay | Admitting: Vascular Surgery

## 2014-07-30 ENCOUNTER — Encounter: Payer: Self-pay | Admitting: Vascular Surgery

## 2014-07-30 VITALS — BP 113/62 | HR 68 | Ht 67.0 in | Wt 217.0 lb

## 2014-07-30 DIAGNOSIS — I6521 Occlusion and stenosis of right carotid artery: Secondary | ICD-10-CM

## 2014-07-30 NOTE — Progress Notes (Signed)
   Patient name: Jason Henderson MRN: [id number] DOB: February 07, 1941 Sex: male  REASON FOR VISIT: follow up after right carotid endarterectomy  VQI PATIENT  HPI: Jason Henderson is a 74 y.o. male who I had been following with a moderate right carotid stenosis. He underwent a right carotid endarterectomy with bovine pericardial patch angioplasty on 07/18/2014. He comes in for a 2 week follow up visit. Overall he is doing well without any specific complaints. He denies any focal weakness or paresthesias.  He is on aspirin. He is on a statin.  REVIEW OF SYSTEMS: Valu.Nieves ] denotes positive finding; [  ] denotes negative finding  CARDIOVASCULAR:  [ ]  chest pain   [ ]  dyspnea on exertion    CONSTITUTIONAL:  [ ]  fever   [ ]  chills  PHYSICAL EXAM: Filed Vitals:   07/30/14 1356 07/30/14 1402  BP: 92/41 113/62  Pulse: 68   Height: 5\' 7"  (1.702 m)   Weight: 217 lb (98.431 kg)   SpO2: 96%    Body mass index is 33.98 kg/(m^2). GENERAL: The patient is a well-nourished male, in no acute distress. The vital signs are documented above. CARDIOVASCULAR: There is a regular rate and rhythm. PULMONARY: There is good air exchange bilaterally without wheezing or rales. His right neck incision is healing nicely. NEURO: He has no focal weakness or paresthesias.  MEDICAL ISSUES: The patient is doing well status post right carotid endarterectomy. He lives in [place]. He feels strongly about trying to continue his follow up in [place]. He'll be coming back to see Dr. Rana Snare concerning his prostate cancer in 8 months so I have scheduled his carotid duplex scan for 8 months instead of 6 months. He knows to continue taking his aspirin and statin.  In addition he underwent percutaneous endovascular repair of abdominal aortic aneurysm in November 2013 for a 5.7 cm infrarenal abdominal aortic aneurysm. His ultrasound on 06/13/2014 showed that the aneurysm had decreased in size to 4.3 cm. He will be due for a follow up  ultrasound in 1 year.  [facility] [place]: [phone]

## 2014-07-31 ENCOUNTER — Other Ambulatory Visit: Payer: Self-pay | Admitting: *Deleted

## 2014-07-31 DIAGNOSIS — N1330 Unspecified hydronephrosis: Secondary | ICD-10-CM | POA: Diagnosis not present

## 2014-07-31 DIAGNOSIS — I6523 Occlusion and stenosis of bilateral carotid arteries: Secondary | ICD-10-CM

## 2014-07-31 DIAGNOSIS — C61 Malignant neoplasm of prostate: Secondary | ICD-10-CM | POA: Diagnosis not present

## 2014-07-31 DIAGNOSIS — C7951 Secondary malignant neoplasm of bone: Secondary | ICD-10-CM | POA: Diagnosis not present

## 2014-09-18 DIAGNOSIS — S299XXA Unspecified injury of thorax, initial encounter: Secondary | ICD-10-CM | POA: Diagnosis not present

## 2014-09-18 DIAGNOSIS — S50811A Abrasion of right forearm, initial encounter: Secondary | ICD-10-CM | POA: Diagnosis not present

## 2014-09-18 DIAGNOSIS — S60511A Abrasion of right hand, initial encounter: Secondary | ICD-10-CM | POA: Diagnosis not present

## 2014-12-16 DIAGNOSIS — C7951 Secondary malignant neoplasm of bone: Secondary | ICD-10-CM | POA: Diagnosis not present

## 2014-12-16 DIAGNOSIS — C61 Malignant neoplasm of prostate: Secondary | ICD-10-CM | POA: Diagnosis not present

## 2014-12-16 DIAGNOSIS — N1330 Unspecified hydronephrosis: Secondary | ICD-10-CM | POA: Diagnosis not present

## 2015-03-09 DIAGNOSIS — Z8546 Personal history of malignant neoplasm of prostate: Secondary | ICD-10-CM | POA: Diagnosis not present

## 2015-03-09 DIAGNOSIS — R42 Dizziness and giddiness: Secondary | ICD-10-CM | POA: Diagnosis not present

## 2015-03-09 DIAGNOSIS — H8110 Benign paroxysmal vertigo, unspecified ear: Secondary | ICD-10-CM | POA: Diagnosis not present

## 2015-03-09 DIAGNOSIS — J3489 Other specified disorders of nose and nasal sinuses: Secondary | ICD-10-CM | POA: Diagnosis not present

## 2015-04-20 DIAGNOSIS — C61 Malignant neoplasm of prostate: Secondary | ICD-10-CM | POA: Diagnosis not present

## 2015-04-22 ENCOUNTER — Encounter (HOSPITAL_COMMUNITY): Payer: Medicare Other

## 2015-04-22 ENCOUNTER — Ambulatory Visit: Payer: Medicare Other | Admitting: Vascular Surgery

## 2015-04-23 ENCOUNTER — Encounter: Payer: Self-pay | Admitting: Vascular Surgery

## 2015-04-29 ENCOUNTER — Ambulatory Visit (INDEPENDENT_AMBULATORY_CARE_PROVIDER_SITE_OTHER): Payer: Medicare Other | Admitting: Vascular Surgery

## 2015-04-29 ENCOUNTER — Encounter: Payer: Self-pay | Admitting: Vascular Surgery

## 2015-04-29 ENCOUNTER — Ambulatory Visit (HOSPITAL_COMMUNITY)
Admission: RE | Admit: 2015-04-29 | Discharge: 2015-04-29 | Disposition: A | Discharge status: Home / Self Care | Payer: Medicare Other | Source: Ambulatory Visit | Attending: Vascular Surgery | Admitting: Vascular Surgery

## 2015-04-29 VITALS — BP 115/58 | HR 57 | Ht 67.0 in | Wt 242.0 lb

## 2015-04-29 DIAGNOSIS — I6521 Occlusion and stenosis of right carotid artery: Secondary | ICD-10-CM | POA: Diagnosis not present

## 2015-04-29 DIAGNOSIS — I6523 Occlusion and stenosis of bilateral carotid arteries: Secondary | ICD-10-CM

## 2015-04-29 DIAGNOSIS — I129 Hypertensive chronic kidney disease with stage 1 through stage 4 chronic kidney disease, or unspecified chronic kidney disease: Secondary | ICD-10-CM | POA: Insufficient documentation

## 2015-04-29 DIAGNOSIS — N189 Chronic kidney disease, unspecified: Secondary | ICD-10-CM | POA: Diagnosis not present

## 2015-04-29 DIAGNOSIS — E785 Hyperlipidemia, unspecified: Secondary | ICD-10-CM | POA: Insufficient documentation

## 2015-04-29 DIAGNOSIS — I739 Peripheral vascular disease, unspecified: Secondary | ICD-10-CM

## 2015-04-29 NOTE — Progress Notes (Signed)
History of Present Illness:  Patient is a 74 y.o. year old male who presents for evaluation of Carotids s/p right CEA 07/18/2014 .  He reports no changes in vision, no weakness on both UE or LE, and no speech changesOther medical problems include has AAA (abdominal aortic aneurysm) ([place]); Abdominal aneurysm without mention of rupture; Aftercare following surgery of the circulatory system, NEC; Occlusion and stenosis of carotid artery without mention of cerebral infarction; Carotid stenosis; and Carotid stenosis, asymptomatic on his problem list.  Past Medical History  Diagnosis Date  . Hypertension   . Abdominal aneurysm without mention of rupture   . Hyperlipidemia   . Prostate cancer ([place])   . Chronic kidney disease     renal insufficiency R side, stent placement    Past Surgical History  Procedure Laterality Date  . Transurethral resection of prostate    . Tonsillectomy and adenoidectomy    . Abdominal aortic endovascular stent graft  04/06/2012    Procedure: ABDOMINAL AORTIC ENDOVASCULAR STENT GRAFT;  Surgeon: Angelia Mould, MD;  Location: [facility];  Service: Vascular;  Laterality: N/A;  GORE; ultrasound guided.  . Abdominal aortagram N/A 04/02/2012    Procedure: ABDOMINAL Maxcine Ham;  Surgeon: Angelia Mould, MD;  Location: [facility] CATH LAB;  Service: Cardiovascular;  Laterality: N/A;  . Prostate surgery    . Colonoscopy w/ polypectomy    . Endarterectomy Right 07/18/2014    Procedure: ENDARTERECTOMY CAROTID;  Surgeon: Angelia Mould, MD;  Location: [facility] OR;  Service: Vascular;  Laterality: Right;  . Patch angioplasty Right 07/18/2014    Procedure: PATCH ANGIOPLASTY USING 1cm X 6cm Bovine Pericardial Patch;  Surgeon: Angelia Mould, MD;  Location: [facility] OR;  Service: Vascular;  Laterality: Right;  . Carotid endarterectomy      Social History Social History  Substance Use Topics  . Smoking status: Former Smoker    Types: Cigarettes    Quit date: 06/13/2014  .  Smokeless tobacco: Never Used     Comment: He has used patches x 2 weeks  . Alcohol Use: No    Family History Family History  Problem Relation Age of Onset  . Cancer Mother     Lung  . Cancer Father     Colon    Allergies  No Known Allergies   Current Outpatient Prescriptions  Medication Sig Dispense Refill  . aspirin 81 MG EC tablet Take 81 mg by mouth daily.      Marland Kitchen atorvastatin (LIPITOR) 80 MG tablet Take 80 mg by mouth daily.    . bicalutamide (CASODEX) 50 MG tablet Take 50 mg by mouth daily.     . Calcium Carbonate-Vitamin D (CALTRATE 600+D PO) Take 1 tablet by mouth daily.    . Cholecalciferol (VITAMIN D3) 2000 UNITS TABS Take 2,000 Int'l Units by mouth. Alternates 1 tablet one day, then 2 tablets the next day    . doxazosin (CARDURA) 2 MG tablet Take 1 mg by mouth daily.     Marland Kitchen Leuprolide Acetate (LUPRON IJ) Inject as directed. Takes 1 injection every 4 months    . lisinopril (PRINIVIL,ZESTRIL) 20 MG tablet Take 10 mg by mouth daily.     Marland Kitchen oxybutynin (DITROPAN-XL) 10 MG 24 hr tablet Take 10 mg by mouth at bedtime.    Marland Kitchen oxyCODONE-acetaminophen (ROXICET) 5-325 MG per tablet Take 1 tablet by mouth every 6 (six) hours as needed for severe pain. (Patient not taking: Reported on 04/29/2015) 20 tablet 0   No  current facility-administered medications for this visit.    ROS:   General:  No weight loss, Fever, chills  HEENT: No recent headaches, no nasal bleeding, no visual changes, no sore throat  Neurologic: No dizziness, blackouts, seizures. No recent symptoms of stroke or mini- stroke. No recent episodes of slurred speech, or temporary blindness.  Cardiac: No recent episodes of chest pain/pressure, no shortness of breath at rest.  No shortness of breath with exertion.  Denies history of atrial fibrillation or irregular heartbeat  Vascular: No history of rest pain in feet.  No history of claudication.  No history of non-healing ulcer, No history of DVT   Pulmonary: No  home oxygen, no productive cough, no hemoptysis,  No asthma or wheezing  Musculoskeletal:  [ ]  Arthritis, [ ]  Low back pain,  [ ]  Joint pain  Hematologic:No history of hypercoagulable state.  No history of easy bleeding.  No history of anemia  Gastrointestinal: No hematochezia or melena,  No gastroesophageal reflux, no trouble swallowing  Urinary: [ ]  chronic Kidney disease, [ ]  on HD - [ ]  MWF or [ ]  TTHS, [ ]  Burning with urination, [ ]  Frequent urination, [ ]  Difficulty urinating;   Skin: No rashes  Psychological: No history of anxiety,  No history of depression   Physical Examination  Filed Vitals:   04/29/15 1347 04/29/15 1349  BP: 121/56 115/58  Pulse: 57   Height: 5\' 7"  (1.702 m)   Weight: 242 lb (109.77 kg)   SpO2: 98%     Body mass index is 37.89 kg/(m^2).  General:  Alert and oriented, no acute distress HEENT: Normal Neck: No bruit or JVD Pulmonary: Clear to auscultation bilaterally Cardiac: Regular Rate and Rhythm without murmur Abdomen: Soft, non-tender, non-distended, no mass, no scars Skin: No rash Extremity Pulses:  2+ radial, brachial Musculoskeletal: No deformity or edema  Neurologic: Upper and lower extremity motor 5/5 and symmetric  DATA:   Carotid duplex shows patent right carotid s/p endarterectomy Left carotid < 40% stenosis   ASSESSMENT:   S/P right CEA  S/P EVAR 03/2012  PLAN:   He lives in [place] and continues to Drive to [facility].  He will f/u in 4 months with Dr. Scot Dock for a duplex of his EVAR.  Then at that point we can arrange annual follow ups of both the EVAR and carotids.    Theda Sers, Kathleene Bergemann MAUREEN PA-C [facility] [place]  He was seen today in conjunction with Dr. Scot Dock

## 2015-04-30 NOTE — Addendum Note (Signed)
Addended by: Dorthula Rue L on: 04/30/2015 02:05 PM   Modules accepted: Orders

## 2015-06-17 ENCOUNTER — Other Ambulatory Visit (HOSPITAL_COMMUNITY): Payer: Medicare Other

## 2015-06-17 ENCOUNTER — Ambulatory Visit: Payer: Medicare Other | Admitting: Family

## 2015-08-18 ENCOUNTER — Encounter: Payer: Self-pay | Admitting: Vascular Surgery

## 2015-08-24 DIAGNOSIS — C61 Malignant neoplasm of prostate: Secondary | ICD-10-CM | POA: Diagnosis not present

## 2015-08-24 DIAGNOSIS — Z0000 Encounter for general adult medical examination without abnormal findings: Secondary | ICD-10-CM | POA: Diagnosis not present

## 2015-08-25 ENCOUNTER — Encounter: Payer: Self-pay | Admitting: *Deleted

## 2015-08-26 ENCOUNTER — Ambulatory Visit (INDEPENDENT_AMBULATORY_CARE_PROVIDER_SITE_OTHER): Payer: Medicare Other | Admitting: Vascular Surgery

## 2015-08-26 ENCOUNTER — Encounter: Payer: Self-pay | Admitting: Vascular Surgery

## 2015-08-26 ENCOUNTER — Ambulatory Visit (HOSPITAL_COMMUNITY)
Admission: RE | Admit: 2015-08-26 | Discharge: 2015-08-26 | Disposition: A | Discharge status: Home / Self Care | Payer: Medicare Other | Source: Ambulatory Visit | Attending: Vascular Surgery | Admitting: Vascular Surgery

## 2015-08-26 VITALS — BP 139/71 | HR 57 | Ht 67.0 in | Wt 237.0 lb

## 2015-08-26 DIAGNOSIS — I6523 Occlusion and stenosis of bilateral carotid arteries: Secondary | ICD-10-CM | POA: Diagnosis not present

## 2015-08-26 DIAGNOSIS — E785 Hyperlipidemia, unspecified: Secondary | ICD-10-CM | POA: Insufficient documentation

## 2015-08-26 DIAGNOSIS — I719 Aortic aneurysm of unspecified site, without rupture: Secondary | ICD-10-CM | POA: Insufficient documentation

## 2015-08-26 DIAGNOSIS — Z95828 Presence of other vascular implants and grafts: Secondary | ICD-10-CM | POA: Diagnosis not present

## 2015-08-26 DIAGNOSIS — Z9889 Other specified postprocedural states: Secondary | ICD-10-CM | POA: Insufficient documentation

## 2015-08-26 DIAGNOSIS — I129 Hypertensive chronic kidney disease with stage 1 through stage 4 chronic kidney disease, or unspecified chronic kidney disease: Secondary | ICD-10-CM | POA: Insufficient documentation

## 2015-08-26 DIAGNOSIS — N189 Chronic kidney disease, unspecified: Secondary | ICD-10-CM | POA: Diagnosis not present

## 2015-08-26 DIAGNOSIS — I6521 Occlusion and stenosis of right carotid artery: Secondary | ICD-10-CM

## 2015-08-26 DIAGNOSIS — I739 Peripheral vascular disease, unspecified: Secondary | ICD-10-CM

## 2015-08-26 NOTE — Addendum Note (Signed)
Addended by: Reola Calkins on: 08/26/2015 02:26 PM   Modules accepted: Orders

## 2015-08-26 NOTE — Progress Notes (Signed)
[facility] [place]  Patient name: Jason Henderson MRN: [id number] DOB: 10/10/1940 Sex: male  REASON FOR VISIT: Follow up after EVAR. Also follow up after previous carotid endarterectomy.  HPI: Jason Henderson is a 75 y.o. male who underwent percutaneous endovascular aneurysm repair in November 2013 for a 5.7 cm infrarenal abdominal aortic aneurysm. In January 2016 the aneurysm had shrunk in size to 4.3 cm. The patient denies any abdominal pain or back pain.  This patient also underwent a right carotid endarterectomy on 07/18/14. He denies any history of stroke, TIAs, expressive or receptive aphasia, or amaurosis fugax.   Past Medical History  Diagnosis Date  . Hypertension   . Abdominal aneurysm without mention of rupture   . Hyperlipidemia   . Prostate cancer ([place])   . Chronic kidney disease     renal insufficiency R side, stent placement    Family History  Problem Relation Age of Onset  . Cancer Mother     Lung  . Cancer Father     Colon    SOCIAL HISTORY: Social History  Substance Use Topics  . Smoking status: Former Smoker    Types: Cigarettes    Quit date: 06/13/2014  . Smokeless tobacco: Never Used     Comment: He has used patches x 2 weeks  . Alcohol Use: No    No Known Allergies  Current Outpatient Prescriptions  Medication Sig Dispense Refill  . aspirin 81 MG EC tablet Take 81 mg by mouth daily.      Marland Kitchen atorvastatin (LIPITOR) 80 MG tablet Take 80 mg by mouth daily.    . bicalutamide (CASODEX) 50 MG tablet Take 50 mg by mouth daily.     . Calcium Carbonate-Vitamin D (CALTRATE 600+D PO) Take 1 tablet by mouth daily.    . Cholecalciferol (VITAMIN D3) 2000 UNITS TABS Take 2,000 Int'l Units by mouth. Alternates 1 tablet one day, then 2 tablets the next day    . doxazosin (CARDURA) 2 MG tablet Take 1 mg by mouth daily.     Marland Kitchen Leuprolide Acetate (LUPRON IJ) Inject as directed. Takes 1 injection every 6 months    . lisinopril  (PRINIVIL,ZESTRIL) 20 MG tablet Take 10 mg by mouth daily.     Marland Kitchen oxybutynin (DITROPAN-XL) 10 MG 24 hr tablet Take 10 mg by mouth at bedtime.    Marland Kitchen oxyCODONE-acetaminophen (ROXICET) 5-325 MG per tablet Take 1 tablet by mouth every 6 (six) hours as needed for severe pain. (Patient not taking: Reported on 04/29/2015) 20 tablet 0   No current facility-administered medications for this visit.    REVIEW OF SYSTEMS:  [X]  denotes positive finding, [ ]  denotes negative finding Cardiac  Comments:  Chest pain or chest pressure:    Shortness of breath upon exertion:    Short of breath when lying flat:    Irregular heart rhythm:        Vascular    Pain in calf, thigh, or hip brought on by ambulation:    Pain in feet at night that wakes you up from your sleep:     Blood clot in your veins:    Leg swelling:         Pulmonary    Oxygen at home:    Productive cough:     Wheezing:         Neurologic    Sudden weakness in arms or legs:     Sudden numbness in arms or legs:  Sudden onset of difficulty speaking or slurred speech:    Temporary loss of vision in one eye:     Problems with dizziness:         Gastrointestinal    Blood in stool:     Vomited blood:         Genitourinary    Burning when urinating:     Blood in urine:        Psychiatric    Major depression:         Hematologic    Bleeding problems:    Problems with blood clotting too easily:        Skin    Rashes or ulcers:        Constitutional    Fever or chills:      PHYSICAL EXAM: Filed Vitals:   08/26/15 1035 08/26/15 1037  BP: 165/69 139/71  Pulse: 57   Height: 5\' 7"  (1.702 m)   Weight: 237 lb (107.502 kg)   SpO2: 96%     GENERAL: The patient is a well-nourished male, in no acute distress. The vital signs are documented above. CARDIAC: There is a regular rate and rhythm.  VASCULAR: do not detect carotid bruits. He has palpable femoral pulses. Feet are warm and well-perfused. PULMONARY: There is good  air exchange bilaterally without wheezing or rales. ABDOMEN: Soft and non-tender with normal pitched bowel sounds.  MUSCULOSKELETAL: There are no major deformities or cyanosis. NEUROLOGIC: No focal weakness or paresthesias are detected. SKIN: There are no ulcers or rashes noted. PSYCHIATRIC: The patient has a normal affect.  DATA:   ABDOMINAL AORTIC DUPLEX: I have independently interpreted his abdominal aortic duplex. The maximum diameter of his aneurysm is 5.5 centimeters. There is no evidence of endoleak. He does have elevated velocities in his celiac axis but a superior mesenteric artery is patent.  CAROTID DUPLEX: Carotid duplex scan on 04/29/2015 shows no evidence of recurrent carotid stenosis on the right and a less than 39% left carotid stenosis.  MEDICAL ISSUES:  STATUS POST EVAR: Follow up duplex scan shows that the aneurysm remains stable without evidence of endoleak. He will need a follow up study in 1 year. I have arranged for him to have his follow up abdominal aortic duplex in April 2018 when he comes back to see Dr. Risa Grill.   STATUS POST RIGHT CAROTID ENDARTERECTOMY ON 07/18/14: he is due for a carotid duplex scan in December 2017. We'll do his follow up carotid duplex scan in April 2018 at the same time as his abdominal aortic aneurysm duplex. He is on aspirin and is on a statin. He quit smoking a year and a half ago.   Deitra Mayo [facility] [place]: [phone]

## 2016-02-29 DIAGNOSIS — C61 Malignant neoplasm of prostate: Secondary | ICD-10-CM | POA: Diagnosis not present

## 2016-05-26 ENCOUNTER — Telehealth: Payer: Self-pay

## 2016-05-26 NOTE — Telephone Encounter (Signed)
Called pt to resched appt. No answer. VM full.   I rescheduled appt and mailed letter to pt to let them know.

## 2016-06-14 ENCOUNTER — Encounter: Payer: Self-pay | Admitting: Cardiology

## 2016-08-31 ENCOUNTER — Ambulatory Visit: Payer: Medicare Other | Admitting: Vascular Surgery

## 2016-08-31 ENCOUNTER — Encounter (HOSPITAL_COMMUNITY): Payer: Medicare Other

## 2016-08-31 ENCOUNTER — Other Ambulatory Visit (HOSPITAL_COMMUNITY): Payer: Medicare Other

## 2016-09-05 DIAGNOSIS — C61 Malignant neoplasm of prostate: Secondary | ICD-10-CM | POA: Diagnosis not present

## 2016-09-27 ENCOUNTER — Encounter: Payer: Self-pay | Admitting: Vascular Surgery

## 2016-10-05 ENCOUNTER — Encounter: Payer: Self-pay | Admitting: Vascular Surgery

## 2016-10-05 ENCOUNTER — Ambulatory Visit (HOSPITAL_COMMUNITY)
Admission: RE | Admit: 2016-10-05 | Discharge: 2016-10-05 | Disposition: A | Discharge status: Home / Self Care | Payer: Medicare Other | Source: Ambulatory Visit | Attending: Vascular Surgery | Admitting: Vascular Surgery

## 2016-10-05 ENCOUNTER — Ambulatory Visit (INDEPENDENT_AMBULATORY_CARE_PROVIDER_SITE_OTHER)
Admission: RE | Admit: 2016-10-05 | Discharge: 2016-10-05 | Disposition: A | Discharge status: Home / Self Care | Payer: Medicare Other | Source: Ambulatory Visit | Attending: Vascular Surgery | Admitting: Vascular Surgery

## 2016-10-05 ENCOUNTER — Ambulatory Visit (INDEPENDENT_AMBULATORY_CARE_PROVIDER_SITE_OTHER): Payer: Medicare Other | Admitting: Vascular Surgery

## 2016-10-05 VITALS — BP 136/62 | HR 61 | Temp 97.9°F | Resp 20 | Ht 67.0 in | Wt 234.0 lb

## 2016-10-05 DIAGNOSIS — Z95828 Presence of other vascular implants and grafts: Secondary | ICD-10-CM | POA: Insufficient documentation

## 2016-10-05 DIAGNOSIS — I6521 Occlusion and stenosis of right carotid artery: Secondary | ICD-10-CM | POA: Diagnosis not present

## 2016-10-05 DIAGNOSIS — I739 Peripheral vascular disease, unspecified: Secondary | ICD-10-CM

## 2016-10-05 LAB — VAS US CAROTID
LCCADDIAS: -20 cm/s
LCCADSYS: -73 cm/s
LEFT ECA DIAS: 9 cm/s
LEFT VERTEBRAL DIAS: 20 cm/s
LICADDIAS: -21 cm/s
LICAPDIAS: -40 cm/s
Left CCA prox dias: 27 cm/s
Left CCA prox sys: 144 cm/s
Left ICA dist sys: -81 cm/s
Left ICA prox sys: -112 cm/s
RCCAPDIAS: 21 cm/s
RIGHT CCA MID DIAS: 27 cm/s
RIGHT ECA DIAS: -22 cm/s
RIGHT VERTEBRAL DIAS: 18 cm/s
Right CCA prox sys: 135 cm/s
Right cca dist sys: -94 cm/s

## 2016-10-05 NOTE — Progress Notes (Signed)
Patient name: Jason Henderson MRN: [id number] DOB: 07-Jan-1941 Sex: male  REASON FOR VISIT:    Follow up status post right carotid endarterectomy in endovascular aneurysm repair.  HPI:   Jason Henderson is a 76 y.o. 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 He also underwent a right carotid endarterectomy in March 2016. He comes in for a 1 year follow up visit. I last saw him on 08/26/2015.  At the time of his last visit, the maximum diameter of his aneurysm was 5.5 cm. There was no evidence of endoleak.  At the time of his last visit, carotid duplex scan showed no evidence of recurrent right carotid stenosis and a less than 30% left carotid stenosis.  Since I saw him last, he denies any abdominal pain or back pain. He denies any history of weakness or paresthesias, expressive or receptive aphasia, or amaurosis fugax.  He is not a smoker.  Past Medical History:  Diagnosis Date  . Abdominal aneurysm without mention of rupture   . Chronic kidney disease    renal insufficiency R side, stent placement  . Hyperlipidemia   . Hypertension   . Prostate cancer [facility])     Family History  Problem Relation Age of Onset  . Cancer Mother        Lung  . Cancer Father        Colon    SOCIAL HISTORY: Social History  Substance Use Topics  . Smoking status: Former Smoker    Types: Cigarettes    Quit date: 06/13/2014  . Smokeless tobacco: Never Used     Comment: He has used patches x 2 weeks  . Alcohol use No    No Known Allergies  Current Outpatient Prescriptions  Medication Sig Dispense Refill  . aspirin 81 MG EC tablet Take 81 mg by mouth daily.      Marland Kitchen atorvastatin (LIPITOR) 80 MG tablet Take 80 mg by mouth daily.    . bicalutamide (CASODEX) 50 MG tablet Take 50 mg by mouth daily.     . Calcium Carbonate-Vitamin D (CALTRATE 600+D PO) Take 1 tablet by mouth daily.    . Cholecalciferol (VITAMIN  D3) 2000 UNITS TABS Take 2,000 Int'l Units by mouth. Alternates 1 tablet one day, then 2 tablets the next day    . doxazosin (CARDURA) 2 MG tablet Take 1 mg by mouth daily.     Marland Kitchen Leuprolide Acetate (LUPRON IJ) Inject as directed. Takes 1 injection every 6 months    . lisinopril (PRINIVIL,ZESTRIL) 20 MG tablet Take 10 mg by mouth daily.     Marland Kitchen oxybutynin (DITROPAN-XL) 10 MG 24 hr tablet Take 10 mg by mouth at bedtime.     No current facility-administered medications for this visit.     REVIEW OF SYSTEMS:  [X]  denotes positive finding, [ ]  denotes negative finding Cardiac  Comments:  Chest pain or chest pressure:    Shortness of breath upon exertion:    Short of breath when lying flat:    Irregular heart rhythm:        Vascular    Pain in calf, thigh, or hip brought on by ambulation:    Pain in feet at night that wakes you up from your sleep:     Blood clot in your veins:    Leg swelling:         Pulmonary    Oxygen at home:  Productive cough:     Wheezing:         Neurologic    Sudden weakness in arms or legs:     Sudden numbness in arms or legs:     Sudden onset of difficulty speaking or slurred speech:    Temporary loss of vision in one eye:     Problems with dizziness:         Gastrointestinal    Blood in stool:     Vomited blood:         Genitourinary    Burning when urinating:     Blood in urine:        Psychiatric    Major depression:         Hematologic    Bleeding problems:    Problems with blood clotting too easily:        Skin    Rashes or ulcers:        Constitutional    Fever or chills:     PHYSICAL EXAM:   Vitals:   10/05/16 0904 10/05/16 0908  BP: (!) 141/65 136/62  Pulse: 61   Resp: 20   Temp: 97.9 F (36.6 C)   TempSrc: Oral   SpO2: 90%   Weight: 234 lb (106.1 kg)   Height: 5\' 7"  (1.702 m)     GENERAL: The patient is a well-nourished male, in no acute distress. The vital signs are documented above. CARDIAC: There is a regular  rate and rhythm.  VASCULAR: He has bilateral carotid bruits. He has palpable femoral and pedal pulses. PULMONARY: There is good air exchange bilaterally without wheezing or rales. ABDOMEN: Soft and non-tender with normal pitched bowel sounds.  MUSCULOSKELETAL: There are no major deformities or cyanosis. NEUROLOGIC: No focal weakness or paresthesias are detected. SKIN: There are no ulcers or rashes noted. PSYCHIATRIC: The patient has a normal affect.  DATA:    DUPLEX ABDOMINAL AORTIC ANEURYSM: I have independently interpreted his duplex of his abdominal aortic aneurysm. The maximum diameter of the aneurysm is 4.5 cm which has not changed compared to the study one year ago. There is no evidence of endoleak.  CAROTID DUPLEX: I have independently interpreted his carotid duplex scan today. On the right side there is no evidence of recurrent stenosis. On the left side there is a 40-59% carotid stenosis. The vertebral arteries are patent with antegrade flow.  MEDICAL ISSUES:   STATUS POST ENDOVASCULAR ANEURYSM REPAIR: His stent graft is in good position with no change in the size of his aneurysm and no evidence of endoleak. I recommended a follow duplex scan in 1 year and I'll see him back at that time.  STATUS POST RIGHT CAROTID ENDARTERECTOMY: He has no evidence of recurrent carotid stenosis on the right. He has a 50-59% left carotid stenosis. I have recommended follow duplex scan is one year which I have ordered. He is on aspirin and is on a statin.  Deitra Mayo [facility] [phone]

## 2016-10-05 NOTE — Addendum Note (Signed)
Addended by: Lianne Cure A on: 10/05/2016 04:16 PM   Modules accepted: Orders

## 2017-03-13 DIAGNOSIS — C7951 Secondary malignant neoplasm of bone: Secondary | ICD-10-CM | POA: Diagnosis not present

## 2017-03-13 DIAGNOSIS — C61 Malignant neoplasm of prostate: Secondary | ICD-10-CM | POA: Diagnosis not present

## 2017-10-11 ENCOUNTER — Ambulatory Visit (INDEPENDENT_AMBULATORY_CARE_PROVIDER_SITE_OTHER): Payer: Medicare Other | Admitting: Vascular Surgery

## 2017-10-11 ENCOUNTER — Other Ambulatory Visit: Payer: Self-pay

## 2017-10-11 ENCOUNTER — Ambulatory Visit (HOSPITAL_COMMUNITY)
Admission: RE | Admit: 2017-10-11 | Discharge: 2017-10-11 | Disposition: A | Discharge status: Home / Self Care | Payer: Medicare Other | Source: Ambulatory Visit | Attending: Vascular Surgery | Admitting: Vascular Surgery

## 2017-10-11 ENCOUNTER — Ambulatory Visit (INDEPENDENT_AMBULATORY_CARE_PROVIDER_SITE_OTHER)
Admission: RE | Admit: 2017-10-11 | Discharge: 2017-10-11 | Disposition: A | Discharge status: Home / Self Care | Payer: Medicare Other | Source: Ambulatory Visit | Attending: Vascular Surgery | Admitting: Vascular Surgery

## 2017-10-11 ENCOUNTER — Encounter: Payer: Self-pay | Admitting: Vascular Surgery

## 2017-10-11 VITALS — BP 119/66 | HR 63 | Resp 20 | Ht 67.0 in | Wt 232.0 lb

## 2017-10-11 DIAGNOSIS — I6523 Occlusion and stenosis of bilateral carotid arteries: Secondary | ICD-10-CM

## 2017-10-11 DIAGNOSIS — Z95828 Presence of other vascular implants and grafts: Secondary | ICD-10-CM | POA: Diagnosis not present

## 2017-10-11 DIAGNOSIS — I714 Abdominal aortic aneurysm, without rupture, unspecified: Secondary | ICD-10-CM

## 2017-10-11 DIAGNOSIS — I6522 Occlusion and stenosis of left carotid artery: Secondary | ICD-10-CM | POA: Insufficient documentation

## 2017-10-11 DIAGNOSIS — I6521 Occlusion and stenosis of right carotid artery: Secondary | ICD-10-CM | POA: Diagnosis not present

## 2017-10-11 DIAGNOSIS — C61 Malignant neoplasm of prostate: Secondary | ICD-10-CM | POA: Diagnosis not present

## 2017-10-11 DIAGNOSIS — C7951 Secondary malignant neoplasm of bone: Secondary | ICD-10-CM | POA: Diagnosis not present

## 2017-10-11 NOTE — Progress Notes (Signed)
Patient name: Jason Henderson MRN: [id number] DOB: [birth year] Sex: male  REASON FOR VISIT:   Follow-up after endovascular aneurysm repair  HPI:   Jason Henderson is a pleasant 77 y.o. male who I last saw on 10/05/2016.  He underwent endovascular repair of a 5.7 centimeter infrarenal abdominal aortic aneurysm in November 2013.  He has also undergone a previous right carotid endarterectomy in March 2016.  He was last seen a year ago.  At that time the maximum diameter of his aneurysm was 4.5 cm.  Carotid duplex at that time showed a 40 to 59% left carotid stenosis with no recurrent stenosis on the right.  He comes in for a yearly follow-up visit.  Since I saw him last he has been doing well.  He stays up in [place] with his son.  He has 2 grandchildren.  He denies any abdominal pain or back pain.  He denies any history of stroke, TIAs, expressive or receptive aphasia, or amaurosis fugax.  He denies any claudication, rest pain, or nonhealing ulcers.  Past Medical History:  Diagnosis Date  . Abdominal aneurysm without mention of rupture   . Chronic kidney disease    renal insufficiency R side, stent placement  . Hyperlipidemia   . Hypertension   . Prostate cancer [facility])     Family History  Problem Relation Age of Onset  . Cancer Mother        Lung  . Cancer Father        Colon    SOCIAL HISTORY: Social History   Tobacco Use  . Smoking status: Former Smoker    Types: Cigarettes    Last attempt to quit: 06/13/2014    Years since quitting: 3.3  . Smokeless tobacco: Never Used  . Tobacco comment: He has used patches x 2 weeks  Substance Use Topics  . Alcohol use: No    Alcohol/week: 0.0 oz    No Known Allergies  Current Outpatient Medications  Medication Sig Dispense Refill  . aspirin 81 MG EC tablet Take 81 mg by mouth daily.      Marland Kitchen atorvastatin (LIPITOR) 80 MG tablet Take 80 mg by mouth daily.    . bicalutamide (CASODEX) 50 MG tablet Take 50 mg by mouth daily.     .  Calcium Carbonate-Vitamin D (CALTRATE 600+D PO) Take 1 tablet by mouth daily.    . Cholecalciferol (VITAMIN D3) 2000 UNITS TABS Take 2,000 Int'l Units by mouth. Alternates 1 tablet one day, then 2 tablets the next day    . doxazosin (CARDURA) 2 MG tablet Take 2 mg by mouth daily.     Marland Kitchen Leuprolide Acetate (LUPRON IJ) Inject as directed. Takes 1 injection every 6 months    . lisinopril (PRINIVIL,ZESTRIL) 20 MG tablet Take 20 mg by mouth daily.     Marland Kitchen oxybutynin (DITROPAN-XL) 10 MG 24 hr tablet Take 10 mg by mouth at bedtime.     No current facility-administered medications for this visit.     REVIEW OF SYSTEMS:  [X]  denotes positive finding, [ ]  denotes negative finding Cardiac  Comments:  Chest pain or chest pressure:    Shortness of breath upon exertion: x   Short of breath when lying flat:    Irregular heart rhythm:        Vascular    Pain in calf, thigh, or hip brought on by ambulation:    Pain in feet at night that wakes you up from your sleep:  Blood clot in your veins:    Leg swelling:         Pulmonary    Oxygen at home:    Productive cough:     Wheezing:         Neurologic    Sudden weakness in arms or legs:     Sudden numbness in arms or legs:     Sudden onset of difficulty speaking or slurred speech:    Temporary loss of vision in one eye:     Problems with dizziness:         Gastrointestinal    Blood in stool:     Vomited blood:         Genitourinary    Burning when urinating:     Blood in urine:        Psychiatric    Major depression:         Hematologic    Bleeding problems:    Problems with blood clotting too easily:        Skin    Rashes or ulcers:        Constitutional    Fever or chills:     PHYSICAL EXAM:   Vitals:   10/11/17 1010 10/11/17 1014  BP: (!) 115/51 119/66  Pulse: 63   Resp: 20   SpO2: 95%   Weight: 232 lb (105.2 kg)   Height: 5\' 7"  (1.702 m)     GENERAL: The patient is a well-nourished male, in no acute distress. The  vital signs are documented above. CARDIAC: There is a regular rate and rhythm.  VASCULAR: I do not detect carotid bruits. He has palpable femoral pulses. Both feet are warm and well-perfused. PULMONARY: There is good air exchange bilaterally without wheezing or rales. ABDOMEN: Soft and non-tender with normal pitched bowel sounds.  MUSCULOSKELETAL: There are no major deformities or cyanosis. NEUROLOGIC: No focal weakness or paresthesias are detected. SKIN: There are no ulcers or rashes noted. PSYCHIATRIC: The patient has a normal affect.  DATA:    DUPLEX ABDOMINAL AORTA: I have independently interpreted his duplex of the abdominal aorta.  The maximum diameter of his aneurysm is 4.4 cm.  The maximum diameter of his right common iliac artery is 1.7 cm.  The maximum diameter of his left common iliac artery is 1.9 cm.  CAROTID DUPLEX: I have independently interpreted his carotid duplex scan.  On the right side he has no evidence of recurrent carotid stenosis.  On the left side, the velocities have improved significantly and he now has a less than 39% carotid stenosis.    Both vertebral arteries are patent with antegrade flow.  MEDICAL ISSUES:   STATUS POST ENDOVASCULAR REPAIR OF ABDOMINAL AORTIC ANEURYSM: His aneurysm was originally 5.7 cm in diameter.  This continues to gradually decrease in size and is now 4.4 cm in maximum diameter.  I have ordered a follow-up duplex scan in 1 year and I will see him back at that time.  Of note I have also explained to him that certainly he can arrange follow-up at the [place] in [place] for his yearly duplex scan given that his aneurysm continues to shrink in size and I do not think his aneurysm will ever be an issue for him.  STATUS POST RIGHT CAROTID ENDARTERECTOMY: Duplex today shows no evidence of recurrent right carotid stenosis.  He has no significant stenosis on the left.  I have ordered a follow-up duplex scan in 1 year.  He is on aspirin  and is on a  statin.  Again I have also explained to him that he could arrange his yearly carotid duplex scan in [place] if this would be more convenient.  He currently makes trips to [place] twice a year to see physicians so for now he would like to continue his follow-up here.  Deitra Mayo [facility] [phone]

## 2017-10-21 DIAGNOSIS — H938X1 Other specified disorders of right ear: Secondary | ICD-10-CM | POA: Diagnosis not present

## 2018-04-17 DIAGNOSIS — C7951 Secondary malignant neoplasm of bone: Secondary | ICD-10-CM | POA: Diagnosis not present

## 2018-04-17 DIAGNOSIS — C61 Malignant neoplasm of prostate: Secondary | ICD-10-CM | POA: Diagnosis not present

## 2018-05-03 DIAGNOSIS — Z23 Encounter for immunization: Secondary | ICD-10-CM | POA: Diagnosis not present
